# Patient Record
Sex: Male | Born: 1975
Health system: Southern US, Community
[De-identification: ages and names within clinical notes are randomized; demographics above are authoritative.]

## PROBLEM LIST (undated history)

## (undated) DIAGNOSIS — Z8719 Personal history of other diseases of the digestive system: Secondary | ICD-10-CM

## (undated) DIAGNOSIS — K219 Gastro-esophageal reflux disease without esophagitis: Secondary | ICD-10-CM

## (undated) HISTORY — PX: ESOPHAGOGASTRODUODENOSCOPY: SHX1529

## (undated) HISTORY — PX: NO PAST SURGERIES: SHX2092

---

## 2001-09-25 ENCOUNTER — Encounter: Admission: RE | Admit: 2001-09-25 | Discharge: 2001-09-25 | Payer: Self-pay | Admitting: Pediatrics

## 2001-09-25 ENCOUNTER — Encounter: Payer: Self-pay | Admitting: Pediatrics

## 2009-11-01 ENCOUNTER — Ambulatory Visit: Payer: Self-pay | Admitting: Family Medicine

## 2009-11-01 DIAGNOSIS — R12 Heartburn: Secondary | ICD-10-CM | POA: Insufficient documentation

## 2009-11-01 DIAGNOSIS — R1013 Epigastric pain: Secondary | ICD-10-CM | POA: Insufficient documentation

## 2009-11-02 ENCOUNTER — Ambulatory Visit: Payer: Self-pay | Admitting: Family Medicine

## 2009-11-03 LAB — CONVERTED CEMR LAB
ALT: 46 units/L (ref 0–53)
AST: 31 units/L (ref 0–37)
Albumin: 4.4 g/dL (ref 3.5–5.2)
Alkaline Phosphatase: 52 units/L (ref 39–117)
BUN: 12 mg/dL (ref 6–23)
Basophils Absolute: 0 10*3/uL (ref 0.0–0.1)
Basophils Relative: 0.7 % (ref 0.0–3.0)
Bilirubin, Direct: 0.1 mg/dL (ref 0.0–0.3)
CO2: 27 meq/L (ref 19–32)
Calcium: 9.5 mg/dL (ref 8.4–10.5)
Chloride: 109 meq/L (ref 96–112)
Cholesterol: 153 mg/dL (ref 0–200)
Creatinine, Ser: 0.9 mg/dL (ref 0.4–1.5)
Eosinophils Absolute: 0.2 10*3/uL (ref 0.0–0.7)
Eosinophils Relative: 2.7 % (ref 0.0–5.0)
GFR calc non Af Amer: 101.89 mL/min (ref >60)
Glucose, Bld: 86 mg/dL (ref 70–99)
H Pylori IgG: POSITIVE
HCT: 45.9 % (ref 39.0–52.0)
HDL: 49.1 mg/dL (ref >39.00)
Hemoglobin: 15 g/dL (ref 13.0–17.0)
LDL Cholesterol: 90 mg/dL (ref 0–99)
Lymphocytes Relative: 38.4 % (ref 12.0–46.0)
Lymphs Abs: 2.3 10*3/uL (ref 0.7–4.0)
MCHC: 32.8 g/dL (ref 30.0–36.0)
MCV: 93.9 fL (ref 78.0–100.0)
Monocytes Absolute: 0.4 10*3/uL (ref 0.1–1.0)
Monocytes Relative: 6.9 % (ref 3.0–12.0)
Neutro Abs: 3 10*3/uL (ref 1.4–7.7)
Neutrophils Relative %: 51.3 % (ref 43.0–77.0)
Platelets: 257 10*3/uL (ref 150.0–400.0)
Potassium: 3.8 meq/L (ref 3.5–5.1)
RBC: 4.89 M/uL (ref 4.22–5.81)
RDW: 12.3 % (ref 11.5–14.6)
Sodium: 140 meq/L (ref 135–145)
Total Bilirubin: 0.8 mg/dL (ref 0.3–1.2)
Total CHOL/HDL Ratio: 3
Total Protein: 6.9 g/dL (ref 6.0–8.3)
Triglycerides: 68 mg/dL (ref 0.0–149.0)
VLDL: 13.6 mg/dL (ref 0.0–40.0)
WBC: 5.9 10*3/uL (ref 4.5–10.5)

## 2009-11-05 ENCOUNTER — Encounter (INDEPENDENT_AMBULATORY_CARE_PROVIDER_SITE_OTHER): Payer: Self-pay | Admitting: *Deleted

## 2009-11-05 ENCOUNTER — Ambulatory Visit: Payer: Self-pay | Admitting: Gastroenterology

## 2009-11-09 ENCOUNTER — Ambulatory Visit: Payer: Self-pay | Admitting: Gastroenterology

## 2009-11-10 ENCOUNTER — Encounter: Payer: Self-pay | Admitting: Gastroenterology

## 2010-03-16 ENCOUNTER — Ambulatory Visit: Payer: Self-pay | Admitting: Internal Medicine

## 2010-03-16 DIAGNOSIS — K921 Melena: Secondary | ICD-10-CM | POA: Insufficient documentation

## 2010-03-16 LAB — CONVERTED CEMR LAB
OCCULT 1: NEGATIVE
OCCULT 2: NEGATIVE

## 2010-03-18 LAB — CONVERTED CEMR LAB
Basophils Absolute: 0.1 10*3/uL (ref 0.0–0.1)
Basophils Relative: 0.9 % (ref 0.0–3.0)
Eosinophils Absolute: 0.2 10*3/uL (ref 0.0–0.7)
Eosinophils Relative: 2.3 % (ref 0.0–5.0)
HCT: 43.9 % (ref 39.0–52.0)
Hemoglobin: 15 g/dL (ref 13.0–17.0)
Lymphocytes Relative: 39.5 % (ref 12.0–46.0)
Lymphs Abs: 2.6 10*3/uL (ref 0.7–4.0)
MCHC: 34.1 g/dL (ref 30.0–36.0)
MCV: 92.1 fL (ref 78.0–100.0)
Monocytes Absolute: 0.4 10*3/uL (ref 0.1–1.0)
Monocytes Relative: 6.5 % (ref 3.0–12.0)
Neutro Abs: 3.3 10*3/uL (ref 1.4–7.7)
Neutrophils Relative %: 50.8 % (ref 43.0–77.0)
Platelets: 280 10*3/uL (ref 150.0–400.0)
RBC: 4.77 M/uL (ref 4.22–5.81)
RDW: 12.3 % (ref 11.5–14.6)
WBC: 6.5 10*3/uL (ref 4.5–10.5)

## 2010-10-18 NOTE — Assessment & Plan Note (Signed)
Summary: BLOOD IN STOOL/CBS   Vital Signs:  Patient profile:   35 year old male Weight:      217 pounds Pulse rate:   86 / minute Pulse rhythm:   regular BP sitting:   128 / 86  (left arm) Cuff size:   regular  Vitals Entered By: Army Fossa CMA (March 16, 2010 11:32 AM) CC: Follow up for blood in stool Comments Went to rehab 2 months ago, had blood in stool. was told to f/u in 1 month to just check. Not seeing any blood in stool now.    History of Present Illness: he was admitted for inpatient rehabilitation in April, in the process they did a physical exam on him and he was found to have Hemoccult-positive stools He is essentially asymptomatic, see review of systems  Allergies (verified): 1)  ! Paxil  Past History:  Past Medical History: h/o ETOH, cocaine, s/p rehab 2005 and 4-11(inpatient)  FAMILY HISTORY OF CAD MALE 1ST DEGREE RELATIVE <50 (ICD-V17.3) HEARTBURN (ICD-787.1) FAMILY HISTORY DIABETES 1ST DEGREE RELATIVE (ICD-V18.0)  Family History: Reviewed history from 11/05/2009 and no changes required. Family History Diabetes 1st degree relative Family History Hypertension Family History of CAD Male 1st degree relative <50 No colon cancer  Review of Systems General:  Denies fatigue and weight loss. GI:  Denies abdominal pain, diarrhea, nausea, and vomiting; he had heartburn but that is a lot better with Prevacid and lifestyle changes.  Physical Exam  General:  alert and well-developed.   Lungs:  normal respiratory effort, no intercostal retractions, no accessory muscle use, and normal breath sounds.   Heart:  normal rate, regular rhythm, and no murmur.   Abdomen:  soft, non-tender, no distention, no masses, no guarding, and no rigidity.     Impression & Recommendations:  Problem # 1:  GUAIAC POSITIVE STOOL (ICD-578.1)  he had severe heartburn, status post EGD 2-11 Status post treatment for H. pylori Now asymptomatic, no family history of colon  Cancer He had positive Hemoccults back in April 2011 self collected  Hemoccult today--->  (-) CBC today  if no anemia, no further w/u at this point  Orders: Venipuncture (81191) TLB-CBC Platelet - w/Differential (85025-CBCD)  Laboratory Results    Stool - Occult Blood Hemmoccult #1: negative Hemoccult #2: negative

## 2010-10-18 NOTE — Letter (Signed)
Summary: Results Letter  [facility]  [address] [place], [place] [address]   Phone: [phone]  Fax: [phone]        November 10, 2009 MRN: [id number]    Patrick Walls [address] [place], [place]  [address]    Dear Mr. CREASON,   The biopsies taken during your recent upper endoscopy suggest that you indeed did have the H. pylori bacteria in your stomach and that the antibiotics you already started are working.  You should continue to follow the recommnedations that we discussed at the time of your procedure.  Please feel free to call if you have any further questions or concerns.       Sincerely,  Rachael Fee MD  This letter has been electronically signed by your physician.  Appended Document: Results Letter  Letter mailed 2.24.11

## 2010-10-18 NOTE — Procedures (Signed)
Summary: Upper Endoscopy  Patient: Patrick Walls Note: All result statuses are Final unless otherwise noted.  Tests: (1) Upper Endoscopy (EGD)   EGD Upper Endoscopy       DONE     [facility]     [address]     [place], [place]  [address]           ENDOSCOPY PROCEDURE REPORT           PATIENT:  Patrick Walls, Patrick Walls  MR#:  [id number]     BIRTHDATE:  [birth year], 33 yrs. old  GENDER:  male     ENDOSCOPIST:  Rachael Fee, MD     Referred by:  Loreen Freud, DO     PROCEDURE DATE:  11/09/2009     PROCEDURE:  EGD with biopsy     ASA CLASS:  Class I     INDICATIONS:  abdominal pain, dypepsia, chronic GERD symptoms     MEDICATIONS:   Fentanyl 75 mcg IV, Versed 8 mg IV     TOPICAL ANESTHETIC:  Exactacain Spray           DESCRIPTION OF PROCEDURE:   After the risks benefits and     alternatives of the procedure were thoroughly explained, informed     consent was obtained.  The LB GIF-H180 [id number] endoscope was     introduced through the mouth and advanced to the second portion of     the duodenum, without limitations.  The instrument was slowly     withdrawn as the mucosa was fully examined.     <<PROCEDUREIMAGES>>     There was mild, non-specific gastritis. Biopsies taken to check     for H. pylori (see image1).  There was mild reflux type     esophagitis (see image7).  A hiatal hernia was found, this was 1-2     cm (see image5).  Otherwise the examination was normal (see     image4, image6, and image2).  Retroflexed views revealed no     abnormalities.    The scope was then withdrawn from the patient     and the procedure completed.     COMPLICATIONS:  None     ENDOSCOPIC IMPRESSION:     1) Mild gastritis, biopsied to check for H. pylori     2) Esophagitis, mild, likely from chronic GERD     3) Small hiatal hernia, this will predispose him to GERD     4) Otherwise normal examination           RECOMMENDATIONS:     He will complete the H. pylori eradication antibiotics  that were     already started for positive serology.     Following that, he will start once daily OTC prilosec for     chronic GERD symptoms.           ______________________________     Rachael Fee, MD           n.     eSIGNED:   Rachael Fee at 11/09/2009 11:33 AM           Valeda Malm, [id number]  Note: An exclamation mark (!) indicates a result that was not dispersed into the flowsheet. Document Creation Date: 11/09/2009 11:33 AM _______________________________________________________________________  (1) Order result status: Final Collection or observation date-time: 11/09/2009 11:25 Requested date-time:  Receipt date-time:  Reported date-time:  Referring Physician:   Ordering Physician: Rob Bunting [phone]) Specimen Source:  Source: Launa Grill  Order Number: [id number] Lab site:

## 2010-10-18 NOTE — Letter (Signed)
Summary: EGD Instructions  [facility]  [address] [place], [place] [address]   Phone: [phone]  Fax: [phone]       ASAIAH SCARBER    [birth year]    MRN: [id number]       Procedure Day /Date:11/09/09     Arrival Time: 1030 am     Procedure Time:1130 am     Location of Procedure:                    [facility] (4th Floor)    PREPARATION FOR ENDOSCOPY   On 11/09/09 THE DAY OF THE PROCEDURE:  1.   No solid foods, milk or milk products are allowed after midnight the night before your procedure.  2.   Do not drink anything colored red or purple.  Avoid juices with pulp.  No orange juice.  3.  You may drink clear liquids until 930 am, which is 2 hours before your procedure.                                                                                                CLEAR LIQUIDS INCLUDE: Water Jello Ice Popsicles Tea (sugar ok, no milk/cream) Powdered fruit flavored drinks Coffee (sugar ok, no milk/cream) Gatorade Juice: apple, white grape, white cranberry  Lemonade Clear bullion, consomm, broth Carbonated beverages (any kind) Strained chicken noodle soup Hard Candy   MEDICATION INSTRUCTIONS  Unless otherwise instructed, you should take regular prescription medications with a small sip of water as early as possible the morning of your procedure.             OTHER INSTRUCTIONS  You will need a responsible adult at least 37 years of age to accompany you and drive you home.   This person must remain in the waiting room during your procedure.  Wear loose fitting clothing that is easily removed.  Leave jewelry and other valuables at home.  However, you may wish to bring a book to read or an iPod/MP3 player to listen to music as you wait for your procedure to start.  Remove all body piercing jewelry and leave at home.  Total time from sign-in until discharge is approximately 2-3 hours.  You should go home directly after  your procedure and rest.  You can resume normal activities the day after your procedure.  The day of your procedure you should not:   Drive   Make legal decisions   Operate machinery   Drink alcohol   Return to work  You will receive specific instructions about eating, activities and medications before you leave.    The above instructions have been reviewed and explained to me by   _______________________    I fully understand and can verbalize these instructions _____________________________ Date _________

## 2010-10-18 NOTE — Assessment & Plan Note (Signed)
Summary: NEW ACUTE PT HERNIA/AETNA/NS/KDC   Vital Signs:  Patient profile:   35 year old male Height:      69.5 inches Weight:      223 pounds BMI:     32.58 Temp:     98.1 degrees F oral Pulse rate:   84 / minute Pulse rhythm:   regular BP sitting:   126 / 80  (left arm) Cuff size:   regular  Vitals Entered By: Army Fossa CMA (November 01, 2009 12:57 PM) CC: New to establish- Pt states over the last 3 months when bending over he feels a pain near his rib cage. Pt says it is uncomfortable. , Heartburn   History of Present Illness: Pt here to establish.    Heartburn      This is a 35 year old man who presents with Heartburn.  Pt here c/o heartburn for years but recently he has had L sided upper abd pain that is worsening.  The patient complains of epigastric pain.  The patient denies the following alarm features: melena, dysphagia, hematemesis, vomiting, involuntary weight loss >5%, and history of anemia.  Symptoms are worse with alcohol, spicy foods, and citrus.  Treatment that was tried and either found to be ineffective or stopped due to problems include an antacid.    Preventive Screening-Counseling & Management  Alcohol-Tobacco     Alcohol drinks/day: <1     Alcohol type: beer     Smoking Status: current     Smoking Cessation Counseling: yes     Smoke Cessation Stage: ready     Packs/Day: 0.5     Year Started: 1994  Caffeine-Diet-Exercise     Caffeine use/day: 4      Drug Use:  no.    Current Medications (verified): 1)  Prilosec Otc 20 Mg Tbec (Omeprazole Magnesium) .Marland Kitchen.. 1 By Mouth Once Daily  Allergies (verified): No Known Drug Allergies  Past History:  Family History: Last updated: 11/01/2009 Family History Diabetes 1st degree relative Family History Hypertension Family History of CAD Male 1st degree relative <50  Social History: Last updated: 11/01/2009 Current Smoker Alcohol use-no Drug use-no  Risk Factors: Smoking Status: current  (11/01/2009) Packs/Day: 0.5 (11/01/2009)  Past medical, surgical, family and social histories (including risk factors) reviewed for relevance to current acute and chronic problems.  Past Medical History: Current Problems:  HEARTBURN (ICD-787.1) FAMILY HISTORY DIABETES 1ST DEGREE RELATIVE  Current Problems:  ABDOMINAL PAIN, EPIGASTRIC (ICD-789.06) FAMILY HISTORY OF CAD MALE 1ST DEGREE RELATIVE <50 (ICD-V17.3) HEARTBURN (ICD-787.1) FAMILY HISTORY DIABETES 1ST DEGREE RELATIVE (ICD-V18.0)  Family History: Reviewed history and no changes required. Family History Diabetes 1st degree relative Family History Hypertension Family History of CAD Male 1st degree relative <50  Social History: Reviewed history and no changes required. Current Smoker Alcohol use-no Drug use-no Smoking Status:  current Drug Use:  no Packs/Day:  0.5 Caffeine use/day:  4  Review of Systems      See HPI  Physical Exam  General:  Well-developed,well-nourished,in no acute distress; alert,appropriate and cooperative throughout examination Neck:  No deformities, masses, or tenderness noted. Lungs:  Normal respiratory effort, chest expands symmetrically. Lungs are clear to auscultation, no crackles or wheezes. Heart:  Normal rate and regular rhythm. S1 and S2 normal without gallop, murmur, click, rub or other extra sounds. Abdomen:  + mid epigastric and Lsided abd tenderness soft, normal bowel sounds, no distention, no masses, no guarding, no rigidity, and no rebound tenderness.   Psych:  Oriented X3 and  normally interactive.     Impression & Recommendations:  Problem # 1:  ABDOMINAL PAIN, EPIGASTRIC (ICD-789.06) Several year hx of heartburn symptoms--- will refer to GI for possible EGD Prilosec otc daily Discussed symptom control with the patient.   Orders: Gastroenterology Referral (GI) EKG w/ Interpretation (93000)  Complete Medication List: 1)  Prilosec Otc 20 Mg Tbec (Omeprazole magnesium) .Marland Kitchen..  1 by mouth once daily  Patient Instructions: 1)  v17.3.  787.1   cbc, bmp, hep, lipid, hpylori   EKG  Procedure date:  11/01/2009  Findings:      Normal sinus rhythm with rate of:  68 bpm

## 2010-10-18 NOTE — Assessment & Plan Note (Signed)
History of Present Illness Visit Type: Initial Consult Primary GI MD: Rob Bunting MD Primary Provider: Loreen Freud, MD Requesting Provider: Loreen Freud, MD Chief Complaint: Epigastric pain History of Present Illness:     very pleasant 35 year old man, here with his GF today, who has had discomforts in abd 4-5 months.  Has had years of pyrosis, takes rolaid 2 times a week, very diet dependent.  Avoids tomatoes.  No nausea, vomitting.  No weight changes recently.  No dysphagia.  Abd straining can bring on the pain.  Eating too much will cause pains.  he was seen in primary care last week for this complaint. Proton pump inhibitor was started empirically. Blood tests show normal CBC, normal complete metabolic profile, H. pylori antibody was positive , he started prev pac meds yesterday.  No NSAIDs, very rare tylenol.           Current Medications (verified): 1)  Prevpac  Misc (Amoxicill-Clarithro-Lansopraz) .... As Directed.  Allergies (verified): No Known Drug Allergies  Past History:  Past Medical History: ABDOMINAL PAIN, EPIGASTRIC (ICD-789.06) FAMILY HISTORY OF CAD MALE 1ST DEGREE RELATIVE <50 (ICD-V17.3) HEARTBURN (ICD-787.1) FAMILY HISTORY DIABETES 1ST DEGREE RELATIVE (ICD-V18.0)  Past Surgical History: none  Family History: Family History Diabetes 1st degree relative Family History Hypertension Family History of CAD Male 1st degree relative <50 No colon cancer  Social History: Current Smoker half pack of cigarettes a day Alcohol use-no Drug use-no drinks 4 caffeinated beverages a day Works as a Scientist, forensic 3 daughters  Review of Systems       Pertinent positive and negative review of systems were noted in the above HPI and GI specific review of systems.  All other review of systems was otherwise negative.   Vital Signs:  Patient profile:   35 year old male Height:      69.5 inches Weight:      222.8 pounds Pulse rate:   78 / minute Pulse rhythm:    regular BP sitting:   122 / 76  (left arm) Cuff size:   regular  Vitals Entered By: Harlow Mares CMA Duncan Dull) (November 05, 2009 1:36 PM)  Physical Exam  Additional Exam:  Constitutional: generally well appearing Psychiatric: alert and oriented times 3 Eyes: extraocular movements intact Mouth: oropharynx moist, no lesions Neck: supple, no lymphadenopathy Cardiovascular: heart regular rate and rythm Lungs: CTA bilaterally Abdomen: soft, non-tender, non-distended, no obvious ascites, no peritoneal signs, normal bowel sounds Extremities: no lower extremity edema bilaterally Skin: no lesions on visible extremities    Impression & Recommendations:  Problem # 1:  GERD, possible GERD related dyspepsia, H. pylori antibody positive it does seem that he at least has an issue with GERD. That may explain all his symptoms. He was H. pylori antibody positive on recent lab tests and has started eradication antibiotics. I recommended he complete those antibiotics. Given his long-term history of reflux, classic GERD symptoms, I recommended we proceed with EGD at his soonest convenience to screen for Barrett's, other complications, I would also biopsy of her H. pylori to see if he indeed has infection.  Patient Instructions: 1)  Complete the Prev Pac. 2)  When it is done, resume once daily OTC prilosec. 3)  You will be scheduled to have an upper endoscopy. 4)  A copy of this information will be sent to Dr. Laury Axon. 5)  The medication list was reviewed and reconciled.  All changed / newly prescribed medications were explained.  A complete medication list was  provided to the patient / caregiver.  Appended Document: Orders Update/EGD    Clinical Lists Changes  Orders: Added new Test order of EGD (EGD) - Signed

## 2011-04-29 ENCOUNTER — Inpatient Hospital Stay (INDEPENDENT_AMBULATORY_CARE_PROVIDER_SITE_OTHER)
Admission: RE | Admit: 2011-04-29 | Discharge: 2011-04-29 | Disposition: A | Discharge status: Home / Self Care | Payer: BC Managed Care – PPO | Source: Ambulatory Visit | Attending: Family Medicine | Admitting: Family Medicine

## 2011-04-29 ENCOUNTER — Encounter: Payer: Self-pay | Admitting: Family Medicine

## 2011-04-29 DIAGNOSIS — Z20828 Contact with and (suspected) exposure to other viral communicable diseases: Secondary | ICD-10-CM

## 2011-05-02 ENCOUNTER — Telehealth (INDEPENDENT_AMBULATORY_CARE_PROVIDER_SITE_OTHER): Payer: Self-pay | Admitting: *Deleted

## 2011-08-21 NOTE — Telephone Encounter (Signed)
  Phone Note Outgoing Call   Call placed by: Clemens Catholic LPN,  May 02, 2011 2:15 PM Call placed to: Patient Summary of Call: call back: spoke to pt and given lab results. Initial call taken by: Clemens Catholic LPN,  May 02, 2011 2:16 PM

## 2011-08-21 NOTE — Progress Notes (Signed)
Summary: SCREENING(rm5)   Vital Signs:  Patient Profile:   35 Years Old Male CC:      screening-std Height:     69.5 inches Weight:      216 pounds O2 Sat:      99 % O2 treatment:    Room Air Temp:     98.1 degrees F oral Pulse rate:   66 / minute Resp:     18 per minute BP sitting:   130 / 86  (left arm) Cuff size:   regular  Vitals Entered By: Linton Flemings RN (April 29, 2011 12:49 PM)                  Updated Prior Medication List: No Medications Current Allergies: ! PAXIL (PAROXETINE HCL)History of Present Illness Chief Complaint: screening-std History of Present Illness: 35 yo M here without any complaints just wants to be screened for STIs.  Was in long monogamous relationship for 10+ years and recently separated from his wife.  No h/o genital lesions, rashes, penile discharge, dysuria, or other complaints.  No prior h/o STIs  REVIEW OF SYSTEMS Constitutional Symptoms      Denies fever, chills, night sweats, weight loss, weight gain, and fatigue.  Eyes       Denies change in vision, eye pain, eye discharge, glasses, contact lenses, and eye surgery. Ear/Nose/Throat/Mouth       Denies hearing loss/aids, change in hearing, ear pain, ear discharge, dizziness, frequent runny nose, frequent nose bleeds, sinus problems, sore throat, hoarseness, and tooth pain or bleeding.  Respiratory       Denies dry cough, productive cough, wheezing, shortness of breath, asthma, bronchitis, and emphysema/COPD.  Cardiovascular       Denies murmurs, chest pain, and tires easily with exhertion.    Gastrointestinal       Denies stomach pain, nausea/vomiting, diarrhea, constipation, blood in bowel movements, and indigestion. Genitourniary       Denies painful urination, kidney stones, and loss of urinary control. Neurological       Denies paralysis, seizures, and fainting/blackouts. Musculoskeletal       Denies muscle pain, joint pain, joint stiffness, decreased range of motion,  redness, swelling, muscle weakness, and gout.  Skin       Denies bruising, unusual mles/lumps or sores, and hair/skin or nail changes.  Psych       Denies mood changes, temper/anger issues, anxiety/stress, speech problems, depression, and sleep problems. Other Comments: denies any symptoms. requesting for screening for STD's   Family History: Reviewed history from 11/05/2009 and no changes required. Family History Diabetes 1st degree relative Family History Hypertension Family History of CAD Male 1st degree relative <50 No colon cancer Physical Exam General appearance: well developed, well nourished, no acute distress GU: patient declined GU exam Assessment New Problems: CONTACT OR EXPOSURE TO OTHER VIRAL DISEASES (ICD-V01.79)   Plan New Orders: T-GC Probe, urine [id number] T-Chlamydia  Probe, urine [id number] T-HIV Antibody  (Reflex) [[id number]] T-RPR (Syphilis) [[id number]] New Patient Level II [99202] Planning Comments:   will screen patient for GC/chlamydia with urine test, HIV and RPR blood tests as well.  Will call him with results.   The patient and/or caregiver has been counseled thoroughly with regard to medications prescribed including dosage, schedule, interactions, rationale for use, and possible side effects and they verbalize understanding.  Diagnoses and expected course of recovery discussed and will return if not improved as expected or if the condition worsens. Patient and/or caregiver verbalized understanding.  Orders Added: 1)  T-GC Probe, urine [id number] 2)  T-Chlamydia  Probe, urine [phone] 3)  T-HIV Antibody  (Reflex) [[id number]] 4)  T-RPR (Syphilis) [[id number]] 5)  New Patient Level II [44010]

## 2011-10-24 ENCOUNTER — Emergency Department (INDEPENDENT_AMBULATORY_CARE_PROVIDER_SITE_OTHER)
Admission: EM | Admit: 2011-10-24 | Discharge: 2011-10-24 | Disposition: A | Discharge status: Home / Self Care | Payer: 59 | Source: Home / Self Care | Attending: Emergency Medicine | Admitting: Emergency Medicine

## 2011-10-24 ENCOUNTER — Encounter: Payer: Self-pay | Admitting: *Deleted

## 2011-10-24 DIAGNOSIS — J329 Chronic sinusitis, unspecified: Secondary | ICD-10-CM

## 2011-10-24 MED ORDER — AMOXICILLIN-POT CLAVULANATE 875-125 MG PO TABS: 1.0000 | ORAL_TABLET | Freq: Two times a day (BID) | ORAL | Status: AC

## 2011-10-24 NOTE — ED Notes (Signed)
Pt c/o RT ear ache and jaw pain x 2 days. He also c/o sinus pain and productive cough x on and off 2 mths. He has taken dayquil.

## 2011-10-24 NOTE — ED Provider Notes (Signed)
History     CSN: [id number]  Arrival date & time 10/24/11  1318   First MD Initiated Contact with Patient 10/24/11 1324      Chief Complaint  Patient presents with  . Otalgia  . Cough  . Facial Pain    (Consider location/radiation/quality/duration/timing/severity/associated sxs/prior treatment) HPI Patrick Walls is a 36 y.o. male who complains of onset of cold symptoms for 7 days.  + sore throat + cough No pleuritic pain No wheezing + nasal congestion + post-nasal drainage ++ sinus pain/pressure No chest congestion No itchy/red eyes + R earache No hemoptysis No SOB + chills/sweats No fever No nausea No vomiting No abdominal pain No diarrhea No skin rashes No fatigue No myalgias + headache    No past medical history on file.  No past surgical history on file.  No family history on file.  History  Substance Use Topics  . Smoking status: Not on file  . Smokeless tobacco: Not on file  . Alcohol Use: Not on file      Review of Systems  Allergies  Paroxetine  Home Medications  No current outpatient prescriptions on file.  BP 112/77  Pulse 78  Temp(Src) 98.2 F (36.8 C) (Oral)  Resp 18  Ht 5\' 10"  (1.778 m)  Wt 212 lb (96.163 kg)  BMI 30.42 kg/m2  SpO2 97%  Physical Exam  Nursing note and vitals reviewed. Constitutional: He is oriented to person, place, and time. He appears well-developed and well-nourished.  HENT:  Head: Normocephalic and atraumatic.  Right Ear: Tympanic membrane, external ear and ear canal normal.  Left Ear: Tympanic membrane, external ear and ear canal normal.  Nose: Mucosal edema and rhinorrhea present.  Mouth/Throat: Posterior oropharyngeal erythema present. No oropharyngeal exudate or posterior oropharyngeal edema.       R sided maxillary sinus tenderness  Eyes: No scleral icterus.  Neck: Neck supple.  Cardiovascular: Regular rhythm and normal heart sounds.   Pulmonary/Chest: Effort normal and breath sounds normal. No  respiratory distress.  Neurological: He is alert and oriented to person, place, and time.  Skin: Skin is warm and dry.  Psychiatric: He has a normal mood and affect. His speech is normal.    ED Course  Procedures (including critical care time)  Labs Reviewed - No data to display No results found.   No diagnosis found.    MDM  1)  Take the prescribed antibiotic as instructed. 2)  Use nasal saline solution (over the counter) at least 3 times a day. 3)  Use over the counter decongestants like Zyrtec-D every 12 hours as needed to help with congestion.  If you have hypertension, do not take medicines with sudafed.  4)  Can take tylenol every 6 hours or motrin every 8 hours for pain or fever. 5)  Follow up with your primary doctor if no improvement in 5-7 days, sooner if increasing pain, fever, or new symptoms.      Lily Kocher, MD 10/24/11 1340

## 2015-05-13 ENCOUNTER — Ambulatory Visit: Payer: Self-pay | Admitting: Family Medicine

## 2015-09-08 ENCOUNTER — Ambulatory Visit (INDEPENDENT_AMBULATORY_CARE_PROVIDER_SITE_OTHER): Payer: 59 | Admitting: Family Medicine

## 2015-09-08 ENCOUNTER — Encounter: Payer: Self-pay | Admitting: Family Medicine

## 2015-09-08 VITALS — BP 142/80 | HR 76 | Ht 69.0 in | Wt 216.0 lb

## 2015-09-08 DIAGNOSIS — R221 Localized swelling, mass and lump, neck: Secondary | ICD-10-CM | POA: Diagnosis not present

## 2015-09-08 NOTE — Assessment & Plan Note (Signed)
Initially the appearance of the cyst was consistent with a non-infected epidermoid cyst. I think this is still a certain possibility. However the location of the cyst and the fact that it was not easily identified on incision and attempted removal increases the likelihood that this is a lymph node or a deep epidermoid cyst. Because of the location and access to other structures I thought it was reasonable to discontinue the procedure. Refer to general surgery for removal. Return in one week for nurse check to recheck blood pressure and remove suture.

## 2015-09-08 NOTE — Progress Notes (Signed)
       Patrick Walls. is a 39 y.o. male who presents to [place]: [facility] today for establish care and discuss cyst on right posterior neck.   Cyst: Patient notes he small superficial nodule on the right posterior cervical neck. It's been present for a few months and is slightly enlarging. He notes it's mildly painful especially with neck motion. He denies any fevers chills nausea vomiting diarrhea or weight loss. He's concerned because his cousin had a diagnosis of head and neck cancer that was originally diagnosed via a swollen lymph node. He does smoke.  He feels well otherwise.   History reviewed. No pertinent past medical history. History reviewed. No pertinent past surgical history. Social History  Substance Use Topics  . Smoking status: Current Every Day Smoker -- 0.50 packs/day  . Smokeless tobacco: Not on file  . Alcohol Use: Yes   family history includes Heart failure in his father.  ROS as above Medications: No current outpatient prescriptions on file.   No current facility-administered medications for this visit.   Allergies  Allergen Reactions  . Paroxetine      Exam:  BP 142/80 mmHg  Pulse 76  Ht 5' 9"  (1.753 m)  Wt 216 lb (97.977 kg)  BMI 31.88 kg/m2 Gen: Well NAD nontoxic appearing HEENT: EOMI,  MMM small superficial mobile nontender nodule right posterior cervical neck. Approximately 1 cm in diameter. Lungs: Normal work of breathing. CTABL Heart: RRR no MRG Abd: NABS, Soft. Nondistended, Nontender Exts: Brisk capillary refill, warm and well perfused.    Incision and removal of cyst: Consent obtained and timeout performed. Patient warned about the risk of infection bleeding damage to structures and other risks. Skin cleaned with alcohol: Spray applied and 1 mL of lidocaine with epinephrine injected superficially to the cyst. The skin was cleaned with  chlorhexidine and sterile drapes were applied. A sharp incision overlying the cyst was made approximately 1 cm in length. Blunt dissection was used to attempt to get to the level of the cyst. The cystic structure was never definitively identified through the dermis and 1 fascial layer. At this time immediate decision to discontinue the procedure is it was becoming more clear that this was either not a epidermoid cyst infected lymph node or was a very deep cystic structure. I felt as though deeper skin surgery on the posterior neck was more risky than I was comfortable with. A simple interrupted suture using 4-0 Prolene was used to close the incision. Patient tolerated the procedure well. No significant blood loss. Sterile technique used during the entire procedure.   No results found for this or any previous visit (from the past 24 hour(s)). No results found.   Please see individual assessment and plan sections.

## 2015-09-08 NOTE — Patient Instructions (Addendum)
Thank you for coming in today. Call or go to the emergency room if you get worse, have trouble breathing, have chest pains, or palpitations.  You should hear from [facility] soon.  Let me know if you don't hear from them soon.  Return in 1 week for a nurse visit to remove the suture.  Epidermal Cyst An epidermal cyst is sometimes called a sebaceous cyst, epidermal inclusion cyst, or infundibular cyst. These cysts usually contain a substance that looks "pasty" or "cheesy" and may have a bad smell. This substance is a protein called keratin. Epidermal cysts are usually found on the face, neck, or trunk. They may also occur in the vaginal area or other parts of the genitalia of both men and women. Epidermal cysts are usually small, painless, slow-growing bumps or lumps that move freely under the skin. It is important not to try to pop them. This may cause an infection and lead to tenderness and swelling. CAUSES  Epidermal cysts may be caused by a deep penetrating injury to the skin or a plugged hair follicle, often associated with acne. SYMPTOMS  Epidermal cysts can become inflamed and cause:  Redness.  Tenderness.  Increased temperature of the skin over the bumps or lumps.  Grayish-white, bad smelling material that drains from the bump or lump. DIAGNOSIS  Epidermal cysts are easily diagnosed by your caregiver during an exam. Rarely, a tissue sample (biopsy) may be taken to rule out other conditions that may resemble epidermal cysts. TREATMENT   Epidermal cysts often get better and disappear on their own. They are rarely ever cancerous.  If a cyst becomes infected, it may become inflamed and tender. This may require opening and draining the cyst. Treatment with antibiotics may be necessary. When the infection is gone, the cyst may be removed with minor surgery.  Small, inflamed cysts can often be treated with antibiotics or by injecting steroid medicines.  Sometimes,  epidermal cysts become large and bothersome. If this happens, surgical removal in your caregiver's office may be necessary. HOME CARE INSTRUCTIONS  Only take over-the-counter or prescription medicines as directed by your caregiver.  Take your antibiotics as directed. Finish them even if you start to feel better. SEEK MEDICAL CARE IF:   Your cyst becomes tender, red, or swollen.  Your condition is not improving or is getting worse.  You have any other questions or concerns. MAKE SURE YOU:  Understand these instructions.  Will watch your condition.  Will get help right away if you are not doing well or get worse.   This information is not intended to replace advice given to you by your health care provider. Make sure you discuss any questions you have with your health care provider.   Document Released: 08/05/2004 Document Revised: 11/27/2011 Document Reviewed: 03/13/2011 Elsevier Interactive Patient Education [employer].

## 2015-09-15 ENCOUNTER — Ambulatory Visit: Payer: 59

## 2015-09-15 ENCOUNTER — Encounter: Payer: Self-pay | Admitting: Family Medicine

## 2015-09-15 ENCOUNTER — Ambulatory Visit (INDEPENDENT_AMBULATORY_CARE_PROVIDER_SITE_OTHER): Payer: 59 | Admitting: Family Medicine

## 2015-09-15 VITALS — BP 158/86 | HR 98 | Wt 212.0 lb

## 2015-09-15 DIAGNOSIS — L03221 Cellulitis of neck: Secondary | ICD-10-CM

## 2015-09-15 DIAGNOSIS — L0390 Cellulitis, unspecified: Secondary | ICD-10-CM | POA: Insufficient documentation

## 2015-09-15 MED ORDER — CEFUROXIME AXETIL 500 MG PO TABS: 500.0000 mg | ORAL_TABLET | Freq: Two times a day (BID) | ORAL | Status: DC

## 2015-09-15 MED ORDER — TRAMADOL HCL 50 MG PO TABS: 50.0000 mg | ORAL_TABLET | Freq: Three times a day (TID) | ORAL | Status: DC | PRN

## 2015-09-15 MED ORDER — DOXYCYCLINE HYCLATE 100 MG PO TABS: 100.0000 mg | ORAL_TABLET | Freq: Two times a day (BID) | ORAL | Status: DC

## 2015-09-15 NOTE — Assessment & Plan Note (Addendum)
The wound appears to have cellulitis versus early abscess formation. It does not appear to be drainable at this time.. Treat with Ceftin and doxycycline. Return if not improved. Follow-up general surgery.

## 2015-09-15 NOTE — Progress Notes (Addendum)
       Patrick Walls. is a 39 y.o. male who presents to [place]: [facility] today for remove suture in follow-up possible skin infection  Patient was seen last week for a mass on his neck. This is thought to be noninfected epidermoid cyst. I attempted to incise and removed the cyst however became clear this was either very deep cyst or a lymph node. The incision and removal was abandoned at that time and the wound was sutured shut with one simple interrupted suture. Sterile techniques were used during the procedure. However over the past few days she's noted the area around the incision is become red and tender and painful. He denies any fevers chills nausea vomiting or diarrhea. No treatment tried yet.   No past medical history on file. No past surgical history on file. Social History  Substance Use Topics  . Smoking status: Current Every Day Smoker -- 0.50 packs/day  . Smokeless tobacco: Not on file  . Alcohol Use: Yes   family history includes Heart failure in his father.  ROS as above Medications: Current Outpatient Prescriptions  Medication Sig Dispense Refill  . cefUROXime (CEFTIN) 500 MG tablet Take 1 tablet (500 mg total) by mouth 2 (two) times daily with a meal. 14 tablet 0  . doxycycline (VIBRA-TABS) 100 MG tablet Take 1 tablet (100 mg total) by mouth 2 (two) times daily. 14 tablet 0   No current facility-administered medications for this visit.   Allergies  Allergen Reactions  . Paroxetine      Exam:  BP 158/86 mmHg  Pulse 98  Wt 212 lb (96.163 kg) Gen: Well NAD nontoxic appearing Skin: Right posterior neck. Erythematous tender indurated without fluctuance proximally 1 cm. Near the area of the incision. The incision is well appearing with no expressible pus. The suture is in place and was removed.   No results found for this or any previous visit (from the past 24  hour(s)). No results found.   Please see individual assessment and plan sections.  PT requested pain medicine later in the day. Tramadol faxed to pharmacy.

## 2015-09-15 NOTE — Patient Instructions (Signed)
Thank you for coming in today. Return if not better.  Follow up with general surgery.   Cellulitis Cellulitis is an infection of the skin and the tissue beneath it. The infected area is usually red and tender. Cellulitis occurs most often in the arms and lower legs.  CAUSES  Cellulitis is caused by bacteria that enter the skin through cracks or cuts in the skin. The most common types of bacteria that cause cellulitis are staphylococci and streptococci. SIGNS AND SYMPTOMS   Redness and warmth.  Swelling.  Tenderness or pain.  Fever. DIAGNOSIS  Your health care provider can usually determine what is wrong based on a physical exam. Blood tests may also be done. TREATMENT  Treatment usually involves taking an antibiotic medicine. HOME CARE INSTRUCTIONS   Take your antibiotic medicine as directed by your health care provider. Finish the antibiotic even if you start to feel better.  Keep the infected arm or leg elevated to reduce swelling.  Apply a warm cloth to the affected area up to 4 times per day to relieve pain.  Take medicines only as directed by your health care provider.  Keep all follow-up visits as directed by your health care provider. SEEK MEDICAL CARE IF:   You notice red streaks coming from the infected area.  Your red area gets larger or turns dark in color.  Your bone or joint underneath the infected area becomes painful after the skin has healed.  Your infection returns in the same area or another area.  You notice a swollen bump in the infected area.  You develop new symptoms.  You have a fever. SEEK IMMEDIATE MEDICAL CARE IF:   You feel very sleepy.  You develop vomiting or diarrhea.  You have a general ill feeling (malaise) with muscle aches and pains.   This information is not intended to replace advice given to you by your health care provider. Make sure you discuss any questions you have with your health care provider.   Document Released:  06/14/2005 Document Revised: 05/26/2015 Document Reviewed: 11/20/2011 Elsevier Interactive Patient Education [employer].

## 2015-09-15 NOTE — Addendum Note (Signed)
Addended by: Gregor Hams on: 09/15/2015 03:59 PM   Modules accepted: Orders

## 2015-09-17 ENCOUNTER — Emergency Department (INDEPENDENT_AMBULATORY_CARE_PROVIDER_SITE_OTHER)
Admission: EM | Admit: 2015-09-17 | Discharge: 2015-09-17 | Disposition: A | Discharge status: Home / Self Care | Payer: 59 | Source: Home / Self Care | Attending: Family Medicine | Admitting: Family Medicine

## 2015-09-17 ENCOUNTER — Encounter: Payer: Self-pay | Admitting: Emergency Medicine

## 2015-09-17 DIAGNOSIS — Z5189 Encounter for other specified aftercare: Secondary | ICD-10-CM

## 2015-09-17 NOTE — ED Provider Notes (Signed)
CSN: [id number]     Arrival date & time 09/17/15  1843 History   First MD Initiated Contact with Patient 09/17/15 1847     Chief Complaint  Patient presents with  . Wound Check   (Consider location/radiation/quality/duration/timing/severity/associated sxs/prior Treatment) HPI Pt is a 39yo male presenting to [facility] for a wound recheck of an abscess on the Right posterior side of his neck that was I&D on 09/08/15.  Pt was placed on antibiotics for abscess 3 days ago. Pain is aching and sore, minimal at this time.  He has been taking antibiotics as prescribed but has only done warm compresses on the first day.  He notes the swelling has gone down and pain has improved, however, he notes there is still mild drainage and wonders if I&D needs to be done again as there was a suture that came out on its own after the initial I&D on 09/08/15.  Denies fever, chills, n/v/d.   History reviewed. No pertinent past medical history. History reviewed. No pertinent past surgical history. Family History  Problem Relation Age of Onset  . Heart failure Father    Social History  Substance Use Topics  . Smoking status: Current Every Day Smoker -- 0.50 packs/day  . Smokeless tobacco: None  . Alcohol Use: Yes    Review of Systems  Constitutional: Negative for fever and chills.  Gastrointestinal: Negative for nausea, vomiting and diarrhea.  Musculoskeletal: Positive for myalgias. Negative for back pain, joint swelling and arthralgias.  Skin: Positive for color change and wound.       Right side of neck    Allergies  Paroxetine  Home Medications   Prior to Admission medications   Medication Sig Start Date End Date Taking? Authorizing Provider  cefUROXime (CEFTIN) 500 MG tablet Take 1 tablet (500 mg total) by mouth 2 (two) times daily with a meal. 09/15/15   Gregor Hams, MD  doxycycline (VIBRA-TABS) 100 MG tablet Take 1 tablet (100 mg total) by mouth 2 (two) times daily. 09/15/15   Gregor Hams, MD   traMADol (ULTRAM) 50 MG tablet Take 1 tablet (50 mg total) by mouth every 8 (eight) hours as needed. 09/15/15   Gregor Hams, MD   Meds Ordered and Administered this Visit  Medications - No data to display  BP 123/80 mmHg  Pulse 91  Temp(Src) 98.3 F (36.8 C) (Oral)  Resp 16  Ht 5' 10"  (1.778 m)  Wt 210 lb (95.255 kg)  BMI 30.13 kg/m2  SpO2 97% No data found.   Physical Exam  Constitutional: He is oriented to person, place, and time. He appears well-developed and well-nourished.  HENT:  Head: Normocephalic and atraumatic.  Eyes: EOM are normal.  Neck: Normal range of motion.  Cardiovascular: Normal rate.   Pulmonary/Chest: Effort normal.  Musculoskeletal: Normal range of motion.  Neurological: He is alert and oriented to person, place, and time.  Skin: Skin is warm and dry. There is erythema.  Right posterior side of neck: quarter sided raised, erythematous area of erythema and induration. Centralized incision that is healing well. No fluctuance. Scant serosanguinous liquid.  No red streaking.   Psychiatric: He has a normal mood and affect. His behavior is normal.  Nursing note and vitals reviewed.   ED Course  Procedures (including critical care time)  Labs Review Labs Reviewed - No data to display  Imaging Review No results found.   MDM   1. Wound check, abscess    Pt presenting to [facility]  for abscess wound check. Abscess appears to be healing well. Induration present but no fluctuance to justify additional I&D.    Encouraged to use warm compresses 3-4 times a day and to keep taking antibiotics as prescribed. F/u with Dr. Georgina Snell in 4-5 days if not improving, otherwise f/u as previously scheduled. Patient verbalized understanding and agreement with treatment plan.     Noland Fordyce, PA-C 09/17/15 [phone]

## 2015-09-17 NOTE — Discharge Instructions (Signed)
Be sure to continue taking all of your antibiotics.  You should also be using warm compresses 3-4 times a day including warm water with epsom salt or warm soapy water.

## 2015-09-17 NOTE — ED Notes (Signed)
Had cyst on back of neck incised 09-08-2015; feels it may be infected, although he was placed on antibiotics by pcp.

## 2015-09-21 ENCOUNTER — Telehealth: Payer: Self-pay | Admitting: *Deleted

## 2015-10-07 ENCOUNTER — Encounter: Payer: 59 | Admitting: Family Medicine

## 2015-12-27 ENCOUNTER — Ambulatory Visit: Payer: 59 | Admitting: Family Medicine

## 2015-12-28 ENCOUNTER — Ambulatory Visit: Payer: Self-pay | Admitting: Family Medicine

## 2016-01-03 ENCOUNTER — Encounter: Payer: Self-pay | Admitting: Family Medicine

## 2016-01-03 ENCOUNTER — Ambulatory Visit (INDEPENDENT_AMBULATORY_CARE_PROVIDER_SITE_OTHER): Payer: Self-pay | Admitting: Family Medicine

## 2016-01-03 VITALS — BP 139/97 | HR 103 | Wt 211.0 lb

## 2016-01-03 DIAGNOSIS — M5442 Lumbago with sciatica, left side: Secondary | ICD-10-CM | POA: Insufficient documentation

## 2016-01-03 MED ORDER — PREDNISONE 10 MG (48) PO TBPK: ORAL_TABLET | Freq: Every day | ORAL | Status: DC

## 2016-01-03 MED ORDER — METHOCARBAMOL 500 MG PO TABS: 500.0000 mg | ORAL_TABLET | Freq: Three times a day (TID) | ORAL | Status: DC

## 2016-01-03 MED FILL — predniSONE 10 MG TABS: 10 | 12 days supply | Qty: 48 | Fill #0

## 2016-01-03 MED FILL — METHOCARBAMOL 500 MG TABLET: 500 | 30 days supply | Qty: 90 | Fill #0

## 2016-01-03 NOTE — Progress Notes (Signed)
   Patrick Walls. is a 40 y.o. male who presents to [place] today for back pain. Patient has a two-week history of left-sided low back pain radiating to the left leg. He's had pain off and on now for several months but has been worsening over the last month and worsening again over the last 2 weeks. He notes he's had difficulty working since early April. He notes the back pain is worse in the radiating pain which is mild. He denies any weakness or numbness or loss of function. He's tried multiple over-the-counter medicines which are only been mildly helpful. He notes he gets health insurance in May and would like to defer imaging or physical therapy until left possible.   No past medical history on file. No past surgical history on file. Social History  Substance Use Topics  . Smoking status: Current Every Day Smoker -- 0.50 packs/day  . Smokeless tobacco: Not on file  . Alcohol Use: Yes   family history includes Heart failure in his father.  ROS:  No headache, visual changes, nausea, vomiting, diarrhea, constipation, dizziness, abdominal pain, skin rash, fevers, chills, night sweats, weight loss, swollen lymph nodes, body aches, joint swelling, muscle aches, chest pain, shortness of breath, mood changes, visual or auditory hallucinations.    Medications: Current Outpatient Prescriptions  Medication Sig Dispense Refill  . methocarbamol (ROBAXIN) 500 MG tablet Take 1 tablet (500 mg total) by mouth 3 (three) times daily. 90 tablet 0  . predniSONE (STERAPRED UNI-PAK 48 TAB) 10 MG (48) TBPK tablet Take by mouth daily. 12 day dosepack po 48 tablet 0   No current facility-administered medications for this visit.   Allergies  Allergen Reactions  . Paroxetine      Exam:  BP 139/97 mmHg  Pulse 103  Wt 211 lb (95.709 kg) General: Well Developed, well nourished, and in no acute distress.  Neuro/Psych: Alert and oriented x3,  extra-ocular muscles intact, able to move all 4 extremities, sensation grossly intact. Skin: Warm and dry, no rashes noted.  Respiratory: Not using accessory muscles, speaking in full sentences, trachea midline.  Cardiovascular: Pulses palpable, no extremity edema. Abdomen: Does not appear distended. MSK: Back: Nontender to midline. Tender palpation left SI joint. Low back motion is normal. Lower extremity strength and reflexes are equal and normal bilaterally. Sensation is intact. Negative straight leg raise test and slump test. Normal gait.   No results found for this or any previous visit (from the past 24 hour(s)). No results found.   Please see individual assessment and plan sections.

## 2016-01-03 NOTE — Patient Instructions (Addendum)
Thank you for coming in today. Come back or go to the emergency room if you notice new weakness new numbness problems walking or bowel or bladder problems.  TENS UNIT: This is helpful for muscle pain and spasm.   Search and Purchase a TENS 7000 2nd edition at www.tenspros.com. It should be less than $30.     TENS unit instructions: Do not shower or bathe with the unit on Turn the unit off before removing electrodes or batteries If the electrodes lose stickiness add a drop of water to the electrodes after they are disconnected from the unit and place on plastic sheet. If you continued to have difficulty, call the TENS unit company to purchase more electrodes. Do not apply lotion on the skin area prior to use. Make sure the skin is clean and dry as this will help prolong the life of the electrodes. After use, always check skin for unusual red areas, rash or other skin difficulties. If there are any skin problems, does not apply electrodes to the same area. Never remove the electrodes from the unit by pulling the wires. Do not use the TENS unit or electrodes other than as directed. Do not change electrode placement without consultating your therapist or physician. Keep 2 fingers with between each electrode. Wear time ratio is 2:1, on to off times.    For example on for 30 minutes off for 15 minutes and then on for 30 minutes off for 15 minutes   Return in May.   Lumbosacral Radiculopathy Lumbosacral radiculopathy is a condition that involves the spinal nerves and nerve roots in the low back and bottom of the spine. The condition develops when these nerves and nerve roots move out of place or become inflamed and cause symptoms. CAUSES This condition may be caused by:  Pressure from a disk that bulges out of place (herniated disk). A disk is a plate of cartilage that separates bones in the spine.  Disk degeneration.  A narrowing of the bones of the lower back (spinal stenosis).  A  tumor.  An infection.  An injury that places sudden pressure on the disks that cushion the bones of your lower spine. RISK FACTORS This condition is more likely to develop in:  Males aged 30-50 years.  Females aged 51-60 years.  People who lift improperly.  People who are overweight or live a sedentary lifestyle.  People who smoke.  People who perform repetitive activities that strain the spine. SYMPTOMS Symptoms of this condition include:  Pain that goes down from the back into the legs (sciatica). This is the most common symptom. The pain may be worse with sitting, coughing, or sneezing.  Pain and numbness in the arms and legs.  Muscle weakness.  Tingling.  Loss of bladder control or bowel control. DIAGNOSIS This condition is diagnosed with a physical exam and medical history. If the pain is lasting, you may have tests, such as:  MRI scan.  X-ray.  CT scan.  Myelogram.  Nerve conduction study. TREATMENT This condition is often treated with:  Hot packs and ice applied to affected areas.  Stretches to improve flexibility.  Exercises to strengthen back muscles.  Physical therapy.  Pain medicine.  A steroid injection in the spine. In some cases, no treatment is needed. If the condition is long-lasting (chronic), or if symptoms are severe, treatment may involve surgery or lifestyle changes, such as following a weight loss plan. HOME CARE INSTRUCTIONS Medicines  Take medicines only as directed by your  health care provider.  Do not drive or operate heavy machinery while taking pain medicine. Injury Care  Apply a heat pack to the injured area as directed by your health care provider.  Apply ice to the affected area:  Put ice in a plastic bag.  Place a towel between your skin and the bag.  Leave the ice on for 20-30 minutes, every 2 hours while you are awake or as needed. Or, leave the ice on for as long as directed by your health care  provider. Other Instructions  If you were shown how to do any exercises or stretches, do them as directed by your health care provider.  If your health care provider prescribed a diet or exercise program, follow it as directed.  Keep all follow-up visits as directed by your health care provider. This is important. SEEK MEDICAL CARE IF:  Your pain does not improve over time even when taking pain medicines. SEEK IMMEDIATE MEDICAL CARE IF:  Your develop severe pain.  Your pain suddenly gets worse.  You develop increasing weakness in your legs.  You lose the ability to control your bladder or bowel.  You have difficulty walking or balancing.  You have a fever.   This information is not intended to replace advice given to you by your health care provider. Make sure you discuss any questions you have with your health care provider.   Document Released: 09/04/2005 Document Revised: 01/19/2015 Document Reviewed: 08/31/2014 Elsevier Interactive Patient Education [employer].

## 2016-01-03 NOTE — Assessment & Plan Note (Signed)
Lumbago with sciatica. Per patient request we'll defer physical therapy and x-ray until May. Prednisone Dosepak and methocarbamol as well as TENS unit and heating pad. Workup provided. Recheck in May. Discussed warning signs and symptoms.

## 2016-01-17 ENCOUNTER — Telehealth: Payer: Self-pay | Admitting: Family Medicine

## 2016-01-17 MED ORDER — NAPROXEN 500 MG PO TABS: 500.0000 mg | ORAL_TABLET | Freq: Two times a day (BID) | ORAL | Status: DC

## 2016-01-17 MED FILL — NAPROXEN 500 MG TABLET: 500 | 15 days supply | Qty: 30 | Fill #0

## 2016-01-17 NOTE — Telephone Encounter (Signed)
Patient has taken the meds you prescribed him but he has seen NO improvement and is hurting more every day.  It is a constant pain despite using a tens unit and heating pad.  Can you please give him something to help with the pain?  He is not someone that ever takes pills, but he is having a really hard time even doing basic things around his house.  He would appreciate anything that might provide him some relief.  He should be receiving his ins card this week so you can order xrays and see if you can figure out what is going on. He is planning to make another appt with you as soon as he has his card.  Thanks!

## 2016-01-17 NOTE — Telephone Encounter (Signed)
Will rx naproxen. Follow up ASAP for further evaluation.

## 2016-01-20 ENCOUNTER — Telehealth: Payer: Self-pay | Admitting: Family Medicine

## 2016-01-20 MED ORDER — PREDNISONE 10 MG (48) PO TBPK: ORAL_TABLET | Freq: Every day | ORAL | Status: DC

## 2016-01-20 NOTE — Telephone Encounter (Signed)
Patient still in A LOT of pain.  He needs something to help with the relief.  He is planning to come in as soon as he receives his insurance card.    He likely needs an MRI.

## 2016-01-20 NOTE — Telephone Encounter (Signed)
We will retry Prednisone pack. MRI ASAP.  Come back or go to the emergency room if you notice new weakness new numbness problems walking or bowel or bladder problems.

## 2016-01-21 ENCOUNTER — Ambulatory Visit: Payer: BLUE CROSS/BLUE SHIELD | Admitting: Family Medicine

## 2016-01-24 ENCOUNTER — Ambulatory Visit: Payer: BLUE CROSS/BLUE SHIELD | Admitting: Family Medicine

## 2016-01-31 ENCOUNTER — Ambulatory Visit (INDEPENDENT_AMBULATORY_CARE_PROVIDER_SITE_OTHER): Payer: BLUE CROSS/BLUE SHIELD | Admitting: Family Medicine

## 2016-01-31 ENCOUNTER — Ambulatory Visit (INDEPENDENT_AMBULATORY_CARE_PROVIDER_SITE_OTHER): Payer: BLUE CROSS/BLUE SHIELD

## 2016-01-31 ENCOUNTER — Encounter: Payer: Self-pay | Admitting: Family Medicine

## 2016-01-31 VITALS — BP 146/89 | HR 96 | Wt 214.0 lb

## 2016-01-31 DIAGNOSIS — M5442 Lumbago with sciatica, left side: Secondary | ICD-10-CM

## 2016-01-31 DIAGNOSIS — M4807 Spinal stenosis, lumbosacral region: Secondary | ICD-10-CM | POA: Diagnosis not present

## 2016-01-31 DIAGNOSIS — M5126 Other intervertebral disc displacement, lumbar region: Secondary | ICD-10-CM

## 2016-01-31 NOTE — Patient Instructions (Signed)
Thank you for coming in today. Get xray and MRI.  We will call with results.   Come back or go to the emergency room if you notice new weakness new numbness problems walking or bowel or bladder problems.

## 2016-01-31 NOTE — Progress Notes (Signed)
       Patrick Walls. is a 40 y.o. male who presents to [facility] today for follow-up back pain. Patient has had back pain in his left low back now for about a month. He notes some pain radiating to the lateral foot. He denies any weakness or numbness. He's tried 2 courses of prednisone Dosepaks which have not helped including methocarbamol which also has not helped very much. The pain is severe and limits his ability to work.   No past medical history on file. No past surgical history on file. Social History  Substance Use Topics  . Smoking status: Current Every Day Smoker -- 0.50 packs/day  . Smokeless tobacco: Not on file  . Alcohol Use: Yes   family history includes Heart failure in his father.  ROS as above Medications: Current Outpatient Prescriptions  Medication Sig Dispense Refill  . methocarbamol (ROBAXIN) 500 MG tablet Take 1 tablet (500 mg total) by mouth 3 (three) times daily. 90 tablet 0  . naproxen (NAPROSYN) 500 MG tablet Take 1 tablet (500 mg total) by mouth 2 (two) times daily with a meal. 30 tablet 0   No current facility-administered medications for this visit.   Allergies  Allergen Reactions  . Paroxetine      Exam:  BP 146/89 mmHg  Pulse 96  Wt 214 lb (97.07 kg) Gen: Well NAD HEENT: EOMI,  MMM Lungs: Normal work of breathing. CTABL Heart: RRR no MRG Abd: NABS, Soft. Nondistended, Nontender Exts: Brisk capillary refill, warm and well perfused.  Back: Tender to palpation left low back including the paraspinal muscles and SI joints. Normal motion. Positive left sided slump test. Lower extremity strength is intact. Normal gait.  No results found for this or any previous visit (from the past 24 hour(s)). Dg Lumbar Spine 2-3 Views  01/31/2016  CLINICAL DATA:  Back pain. Pain radiates into left leg. No known injury. EXAM: LUMBAR SPINE - 2-3 VIEW  COMPARISON:  No prior . FINDINGS: Diffuse multilevel degenerative change. No acute bony abnormality identified. Normal alignment. Calcified pelvic density consistent phlebolith the IMPRESSION: Diffuse multilevel degenerative change.  No acute abnormality. Electronically Signed   By: Marcello Moores  Register   On: 01/31/2016 82:82    41 year old male with left low back pain with left-sided radiculopathy likely at the L5 or S1 nerve root. Plan for x-ray as noted above, and MRI of the low back for epidural steroid injection planning. Will call patient with results.

## 2016-02-01 ENCOUNTER — Telehealth: Payer: Self-pay | Admitting: Family Medicine

## 2016-02-01 DIAGNOSIS — M5416 Radiculopathy, lumbar region: Secondary | ICD-10-CM

## 2016-02-01 NOTE — Telephone Encounter (Signed)
Called and left scheduling message with [facility] to schedule with pt.

## 2016-02-01 NOTE — Progress Notes (Signed)
Quick Note:  Arthritis is bulging disc in the back causes nerve root pinching. We will order an epidural steroid injection that should help. ______

## 2016-02-01 NOTE — Telephone Encounter (Signed)
Epidural steroid injection ordered.

## 2016-02-09 ENCOUNTER — Ambulatory Visit
Admission: RE | Admit: 2016-02-09 | Discharge: 2016-02-09 | Disposition: A | Discharge status: Home / Self Care | Payer: BLUE CROSS/BLUE SHIELD | Source: Ambulatory Visit | Attending: Family Medicine | Admitting: Family Medicine

## 2016-02-09 MED ORDER — METHYLPREDNISOLONE ACETATE 40 MG/ML INJ SUSP (RADIOLOG: 120.0000 mg | Freq: Once | INTRAMUSCULAR | Status: DC

## 2016-02-09 MED ORDER — IOPAMIDOL (ISOVUE-M 200) INJECTION 41%: 1.0000 mL | Freq: Once | INTRAMUSCULAR | Status: DC

## 2016-02-09 NOTE — Discharge Instructions (Signed)

## 2016-02-13 ENCOUNTER — Telehealth: Payer: Self-pay | Admitting: Family Medicine

## 2016-02-13 NOTE — Telephone Encounter (Signed)
So patient wants to try to return to work on Tuesday, just doing some light duty work.  Can you please write a note that he can go back to work with limited lifting restrictions?    I can fax over to his employer.  Thanks!  He reports that he feels like he has gotten some relief from the injection.

## 2016-02-15 ENCOUNTER — Encounter: Payer: Self-pay | Admitting: Family Medicine

## 2016-02-15 NOTE — Telephone Encounter (Signed)
Note written.

## 2016-02-23 ENCOUNTER — Encounter: Payer: Self-pay | Admitting: Family Medicine

## 2016-02-23 ENCOUNTER — Telehealth: Payer: Self-pay | Admitting: Family Medicine

## 2016-02-23 NOTE — Telephone Encounter (Signed)
Letter written

## 2016-02-23 NOTE — Telephone Encounter (Signed)
If you could do a letter releasing him to full duty, that would be great.  Thanks so much.

## 2016-04-20 ENCOUNTER — Encounter: Payer: Self-pay | Admitting: Family Medicine

## 2016-04-20 ENCOUNTER — Ambulatory Visit (INDEPENDENT_AMBULATORY_CARE_PROVIDER_SITE_OTHER): Payer: BLUE CROSS/BLUE SHIELD | Admitting: Family Medicine

## 2016-04-20 DIAGNOSIS — R591 Generalized enlarged lymph nodes: Secondary | ICD-10-CM | POA: Diagnosis not present

## 2016-04-20 DIAGNOSIS — R599 Enlarged lymph nodes, unspecified: Secondary | ICD-10-CM | POA: Insufficient documentation

## 2016-04-20 MED ORDER — DOXYCYCLINE HYCLATE 100 MG PO TABS: 100.0000 mg | ORAL_TABLET | Freq: Two times a day (BID) | ORAL | 0 refills | Status: DC

## 2016-04-20 MED ORDER — CEFUROXIME AXETIL 500 MG PO TABS: 500.0000 mg | ORAL_TABLET | Freq: Two times a day (BID) | ORAL | 0 refills | Status: DC

## 2016-04-20 MED FILL — DOXYCYCLINE 100 MG TABLET: 100 | 7 days supply | Qty: 14 | Fill #0

## 2016-04-20 MED FILL — CEFUROXIME AXETIL 500 MG TA: 500 | 7 days supply | Qty: 14 | Fill #0

## 2016-04-20 NOTE — Progress Notes (Signed)
       Patrick Walls. is a 40 y.o. male who presents to [facility] today for swollen painful cyst. Patient returns to clinic today with a swollen painful cyst on the right posterior neck. This is the same area that became infected previously. Previously attempted an incision and removal of what I suspected was a sebaceous cyst however became clear that this is probably enlarged lymph node and I discontinue the procedure. Since then he's had now 2 infections in this area that responded to antibiotics. He notes worsening redness pain and swelling over the last few days. He denies any fevers chills nausea vomiting or diarrhea.   No past medical history on file. No past surgical history on file. Social History  Substance Use Topics  . Smoking status: Current Every Day Smoker    Packs/day: 0.50  . Smokeless tobacco: Not on file  . Alcohol use Yes   family history includes Heart failure in his father.  ROS as above:  Medications: Current Outpatient Prescriptions  Medication Sig Dispense Refill  . cefUROXime (CEFTIN) 500 MG tablet Take 1 tablet (500 mg total) by mouth 2 (two) times daily with a meal. 14 tablet 0  . doxycycline (VIBRA-TABS) 100 MG tablet Take 1 tablet (100 mg total) by mouth 2 (two) times daily. 14 tablet 0   No current facility-administered medications for this visit.    Allergies  Allergen Reactions  . Paxil [Paroxetine]      Exam:  BP (!) 137/92   Pulse 86   Temp 97.6 F (36.4 C) (Oral)   Wt 220 lb (99.8 kg)   BMI 31.57 kg/m  Gen: Well NAD Nontoxic appearing Skin: 1 cm erythematous indurated tender lesion right posterior neck with no fluctuance palpated.  No results found for this or any previous visit (from the past 24 hour(s)). No results found.    Assessment and Plan: 40 y.o. male with infected enlarged lymph node. Treat with dual  antibiotic therapy. Refer to general surgery for evaluation of possible excisional biopsy/removal.   Orders Placed This Encounter  Procedures  . Ambulatory referral to General Surgery    Referral Priority:   Routine    Referral Type:   Surgical    Referral Reason:   Specialty Services Required    Requested Specialty:   General Surgery    Number of Visits Requested:   1    Discussed warning signs or symptoms. Please see discharge instructions. Patient expresses understanding.

## 2016-04-20 NOTE — Patient Instructions (Signed)
Thank you for coming in today. Take both antibiotics. Return if worsening. Follow-up with [facility] surgery for evaluation of removal of lymph node

## 2016-05-09 ENCOUNTER — Ambulatory Visit: Payer: BLUE CROSS/BLUE SHIELD | Admitting: Primary Care

## 2016-05-17 ENCOUNTER — Telehealth: Payer: Self-pay

## 2016-05-17 DIAGNOSIS — M5442 Lumbago with sciatica, left side: Secondary | ICD-10-CM

## 2016-05-17 NOTE — Telephone Encounter (Signed)
Epidural steroid injection ordered

## 2016-05-24 ENCOUNTER — Ambulatory Visit
Admission: RE | Admit: 2016-05-24 | Discharge: 2016-05-24 | Disposition: A | Discharge status: Home / Self Care | Payer: BLUE CROSS/BLUE SHIELD | Source: Ambulatory Visit | Attending: Family Medicine | Admitting: Family Medicine

## 2016-05-24 VITALS — BP 132/75 | HR 94

## 2016-05-24 DIAGNOSIS — M5442 Lumbago with sciatica, left side: Secondary | ICD-10-CM

## 2016-05-24 MED ORDER — METHYLPREDNISOLONE ACETATE 40 MG/ML INJ SUSP (RADIOLOG
120.0000 mg | Freq: Once | INTRAMUSCULAR | Status: AC
  Administered 2016-05-24: 120 mg via EPIDURAL

## 2016-05-24 MED ORDER — IOPAMIDOL (ISOVUE-M 200) INJECTION 41%
1.0000 mL | Freq: Once | INTRAMUSCULAR | Status: AC
  Administered 2016-05-24: 1 mL via EPIDURAL

## 2016-05-24 NOTE — Discharge Instructions (Signed)

## 2017-03-17 ENCOUNTER — Emergency Department
Admission: EM | Admit: 2017-03-17 | Discharge: 2017-03-17 | Disposition: A | Discharge status: Home / Self Care | Payer: 59 | Source: Home / Self Care | Attending: Family Medicine | Admitting: Family Medicine

## 2017-03-17 ENCOUNTER — Encounter: Payer: Self-pay | Admitting: Emergency Medicine

## 2017-03-17 DIAGNOSIS — L03221 Cellulitis of neck: Secondary | ICD-10-CM | POA: Diagnosis not present

## 2017-03-17 DIAGNOSIS — R599 Enlarged lymph nodes, unspecified: Secondary | ICD-10-CM

## 2017-03-17 MED ORDER — DOXYCYCLINE HYCLATE 100 MG PO TABS: 100.0000 mg | ORAL_TABLET | Freq: Two times a day (BID) | ORAL | 0 refills | Status: DC

## 2017-03-17 MED ORDER — CEFUROXIME AXETIL 500 MG PO TABS: 500.0000 mg | ORAL_TABLET | Freq: Two times a day (BID) | ORAL | 0 refills | Status: DC

## 2017-03-17 NOTE — ED Triage Notes (Signed)
Patient presents to [facility] with C/O a questionable skin infection. He states he had a small area appear on the right side of his neck and he feels it could be an infection. He also states tat he had the exact same situation 14 months ago and Dr. Georgina Snell lanced it. He developed a severe infection and was on antibiotic.

## 2017-03-17 NOTE — ED Provider Notes (Signed)
CSN: [id number]     Arrival date & time 03/17/17  0905 History   First MD Initiated Contact with Patient 03/17/17 [phone]     Chief Complaint  Patient presents with  . Skin Problem   (Consider location/radiation/quality/duration/timing/severity/associated sxs/prior Treatment) HPI Patrick Keegen Heffern. is a 41 y.o. male presenting to UC with c/o questionable recurrent skin infection on the back of neck on Right side that has gradually worsened over the last 1 week.  He reports having a small tender nodule in the same spot about 14 months ago. It was initially thought to be an infected cyst, I&D was attempted w/o success. Area became reddened and more swollen, ultimately treated with Ceftin and Doxycycline.  Denies fever, chills, n/v/d. Denies recent injury to that are or illness. No ear pain.    History reviewed. No pertinent past medical history. History reviewed. No pertinent surgical history. Family History  Problem Relation Age of Onset  . Heart failure Father    Social History  Substance Use Topics  . Smoking status: Former Smoker    Packs/day: 0.50  . Smokeless tobacco: Never Used  . Alcohol use No    Review of Systems  Constitutional: Negative for chills and fever.  HENT: Negative for ear pain and sore throat.   Musculoskeletal: Positive for neck pain. Negative for back pain and neck stiffness.  Skin: Negative for color change and wound.  Neurological: Negative for dizziness, light-headedness and headaches.    Allergies  Paxil [paroxetine]  Home Medications   Prior to Admission medications   Medication Sig Start Date End Date Taking? Authorizing Provider  cefUROXime (CEFTIN) 500 MG tablet Take 1 tablet (500 mg total) by mouth 2 (two) times daily with a meal. 03/17/17   Noe Gens, PA-C  doxycycline (VIBRA-TABS) 100 MG tablet Take 1 tablet (100 mg total) by mouth 2 (two) times daily. 03/17/17   Noe Gens, PA-C   Meds Ordered and Administered this Visit    Medications - No data to display  BP 119/81 (BP Location: Left Arm)   Pulse 75   Temp 98.2 F (36.8 C) (Oral)   Resp 16   Ht 5' 9"  (1.753 m)   Wt 216 lb 8 oz (98.2 kg)   SpO2 97%   BMI 31.97 kg/m  No data found.   Physical Exam  Constitutional: He is oriented to person, place, and time. He appears well-developed and well-nourished.  HENT:  Head: Normocephalic and atraumatic.    Pea-sized nodule, flesh colored with overlying well healed scar c/w prior I&D. Tender. Mobile. No fluctuance or red streaking.   Eyes: EOM are normal.  Neck: Normal range of motion.  Cardiovascular: Normal rate.   Pulmonary/Chest: Effort normal.  Musculoskeletal: Normal range of motion.  Neurological: He is alert and oriented to person, place, and time.  Skin: Skin is warm and dry.  Psychiatric: He has a normal mood and affect. His behavior is normal.  Nursing note and vitals reviewed.   Urgent Care Course     Procedures (including critical care time)  Labs Review Labs Reviewed - No data to display  Imaging Review No results found.  MDM   1. Cellulitis, neck   2. Enlarged lymph node    Medical records reviewed. Questionable infected lymph node.  Will hold off on I&D at this time as it appears to have worsened symptoms last year.   Encouraged trying antibiotics and warm compresses for 1 week Rx: Ceftin and Doxycycline F/u with  PCP, may need referral to dermatology or general surgery for possible biopsy and/or removal.    Noe Gens, PA-C 03/17/17 1500

## 2017-05-31 ENCOUNTER — Emergency Department (HOSPITAL_BASED_OUTPATIENT_CLINIC_OR_DEPARTMENT_OTHER)
Admission: EM | Admit: 2017-05-31 | Discharge: 2017-05-31 | Disposition: A | Discharge status: Home / Self Care | Payer: 59 | Attending: Emergency Medicine | Admitting: Emergency Medicine

## 2017-05-31 ENCOUNTER — Emergency Department (HOSPITAL_BASED_OUTPATIENT_CLINIC_OR_DEPARTMENT_OTHER): Payer: 59

## 2017-05-31 ENCOUNTER — Encounter (HOSPITAL_BASED_OUTPATIENT_CLINIC_OR_DEPARTMENT_OTHER): Payer: Self-pay | Admitting: *Deleted

## 2017-05-31 DIAGNOSIS — Z79899 Other long term (current) drug therapy: Secondary | ICD-10-CM | POA: Insufficient documentation

## 2017-05-31 DIAGNOSIS — F17200 Nicotine dependence, unspecified, uncomplicated: Secondary | ICD-10-CM | POA: Diagnosis not present

## 2017-05-31 DIAGNOSIS — R079 Chest pain, unspecified: Secondary | ICD-10-CM | POA: Diagnosis not present

## 2017-05-31 DIAGNOSIS — R030 Elevated blood-pressure reading, without diagnosis of hypertension: Secondary | ICD-10-CM | POA: Diagnosis not present

## 2017-05-31 LAB — BASIC METABOLIC PANEL
ANION GAP: 8 (ref 5–15)
BUN: 11 mg/dL (ref 6–20)
CHLORIDE: 105 mmol/L (ref 101–111)
CO2: 24 mmol/L (ref 22–32)
Calcium: 9.6 mg/dL (ref 8.9–10.3)
Creatinine, Ser: 0.84 mg/dL (ref 0.61–1.24)
GFR calc Af Amer: >60 mL/min (ref >60)
Glucose, Bld: 101 mg/dL — ABNORMAL HIGH (ref 65–99)
POTASSIUM: 4 mmol/L (ref 3.5–5.1)
SODIUM: 137 mmol/L (ref 135–145)

## 2017-05-31 LAB — CBG MONITORING, ED: GLUCOSE-CAPILLARY: 99 mg/dL (ref 65–99)

## 2017-05-31 LAB — CBC
HCT: 44.5 % (ref 39.0–52.0)
HEMOGLOBIN: 15 g/dL (ref 13.0–17.0)
MCH: 31.1 pg (ref 26.0–34.0)
MCHC: 33.7 g/dL (ref 30.0–36.0)
MCV: 92.1 fL (ref 78.0–100.0)
Platelets: 300 10*3/uL (ref 150–400)
RBC: 4.83 MIL/uL (ref 4.22–5.81)
RDW: 12.5 % (ref 11.5–15.5)
WBC: 6.1 10*3/uL (ref 4.0–10.5)

## 2017-05-31 LAB — TROPONIN I

## 2017-05-31 MED ORDER — ASPIRIN 81 MG PO CHEW
324.0000 mg | CHEWABLE_TABLET | Freq: Once | ORAL | Status: AC
  Administered 2017-05-31: 324 mg via ORAL
  Filled 2017-05-31: qty 4

## 2017-05-31 NOTE — Discharge Instructions (Signed)

## 2017-05-31 NOTE — ED Notes (Signed)
Pt on monitor 

## 2017-05-31 NOTE — ED Notes (Signed)
Pt given note for work. Verbalizes understanding to f/u with cardiology as directed. Family present to drive
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
## 2017-05-31 NOTE — ED Triage Notes (Signed)
Pt reports sudden onset chest tightness and left arm "tingling" while driving 2 days. States felt dizzy. Denies n/v. Pt states symptoms resolved in about an hour. Denies pain today but wanted get checked out.

## 2017-07-15 IMAGING — MR MR LUMBAR SPINE W/O CM
4 of 5 series · 26 of 48 positions shown · non-contrast
Comparison: Plain films lumbar spine 01/31/2016.

CLINICAL DATA: Mid and low back pain radiating into the left
buttock, leg lateral left foot 2-3 months. No known injury. Initial
encounter.

EXAM:
MRI LUMBAR SPINE WITHOUT CONTRAST
TECHNIQUE: Multiplanar, multisequence MR imaging of the lumbar spine was
performed. No intravenous contrast was administered.

[Series 2: T2 · sagittal · 4.0mm · 0.81mm/px · 6 of 19 slices shown (1 of 2)]
[im 1/19]
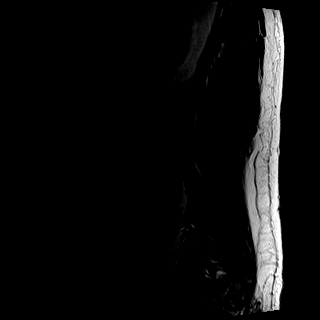
[im 4/19]
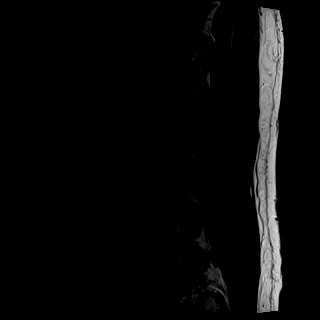
[im 8/19]
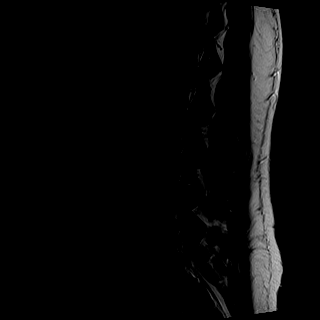
[im 11/19]
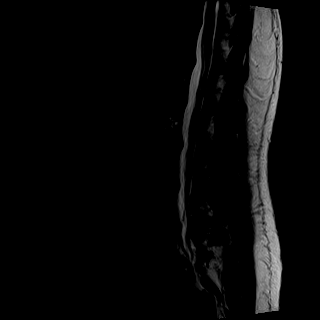
[im 15/19]
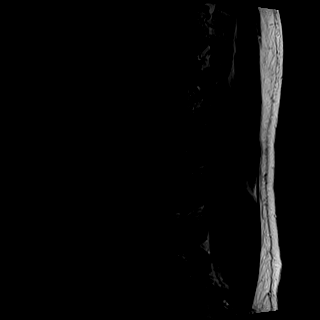
[im 19/19]
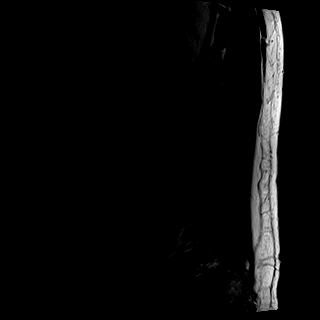

[Series 3: T1 · sagittal · 4.0mm · 0.41mm/px · 6 of 19 slices shown (1 of 2)]
[im 1/19]
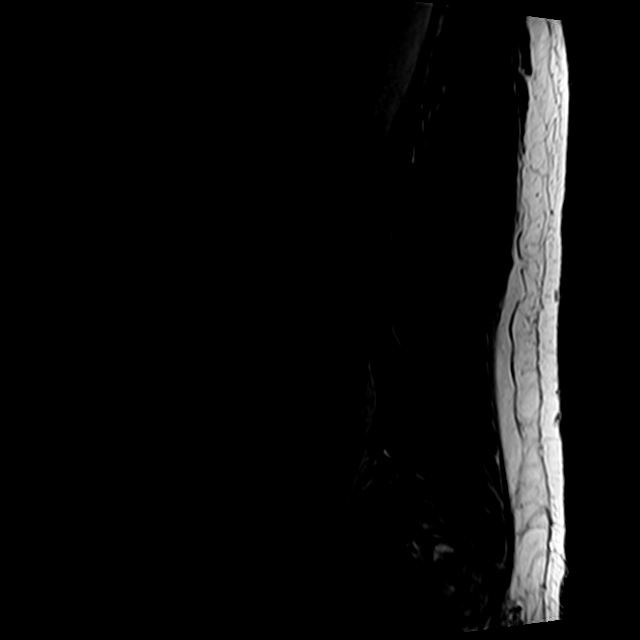
[im 4/19]
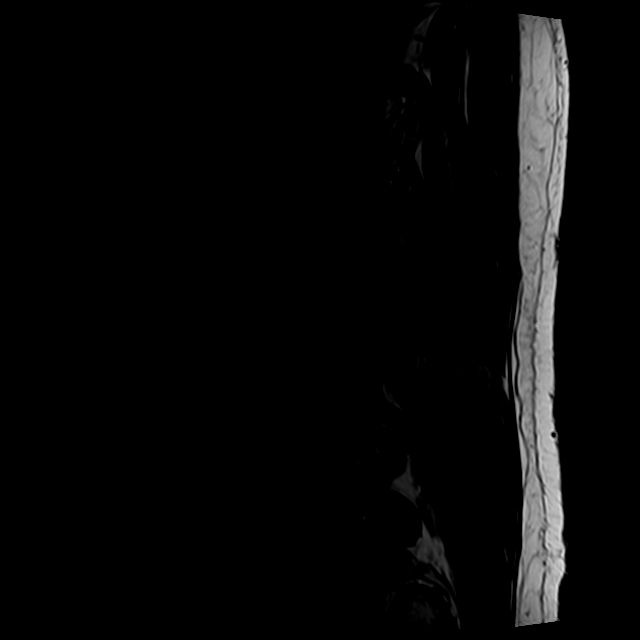
[im 8/19]
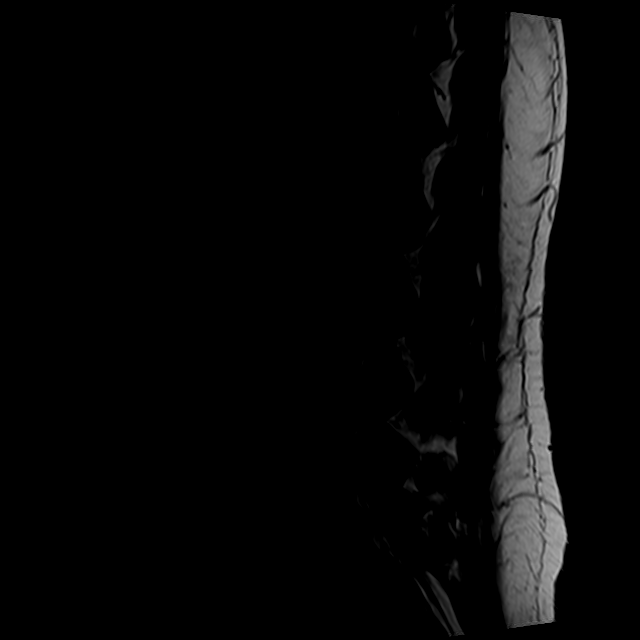
[im 11/19]
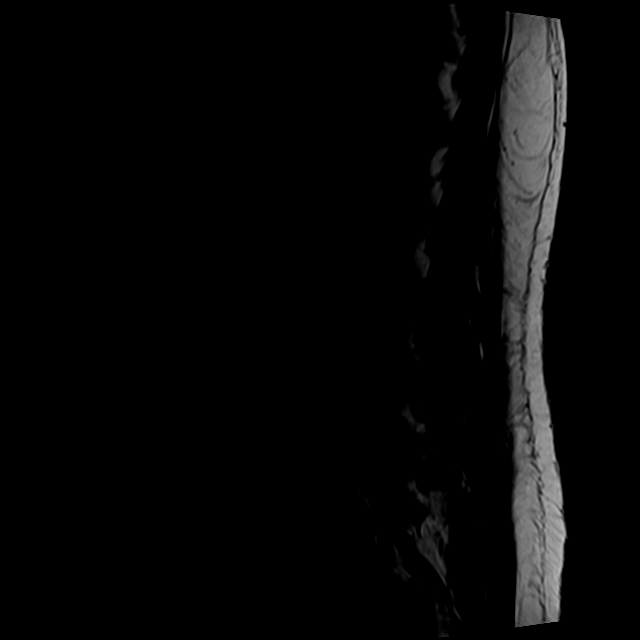
[im 15/19]
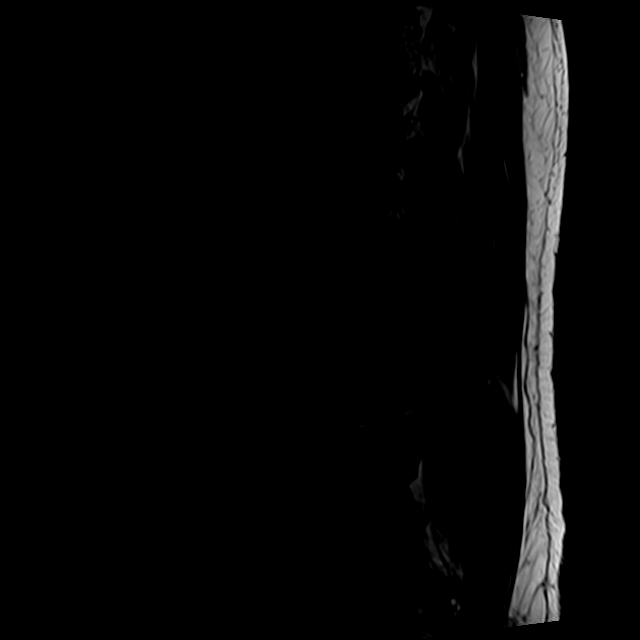
[im 19/19]
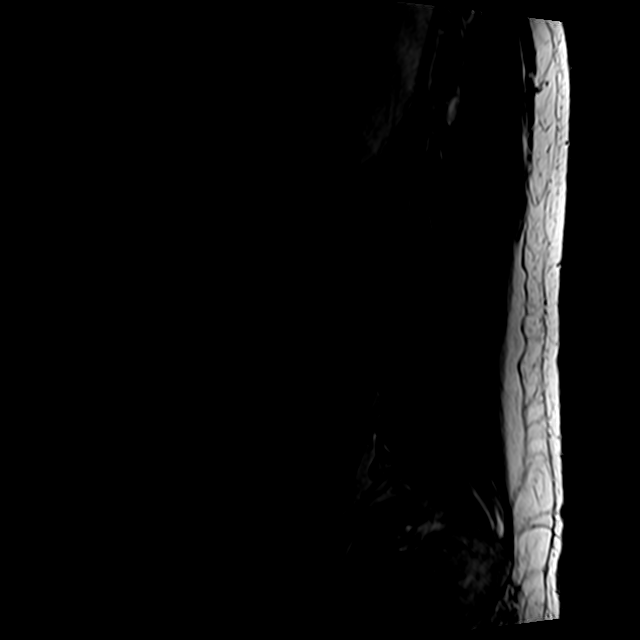

[Series 5: T2 · axial · 4.0mm · 0.78mm/px · z∈[-149,+95]mm · 9 of 48 slices shown (2 of 2)]
[im 1/48]
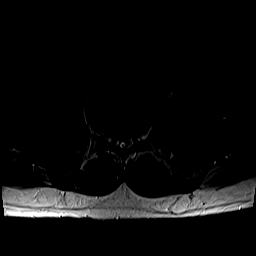
[im 7/48]
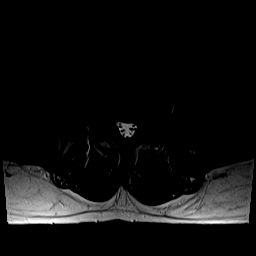
[im 14/48]
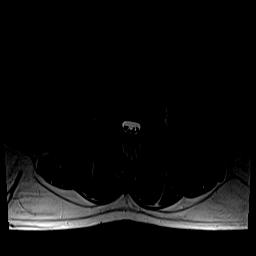
[im 21/48]
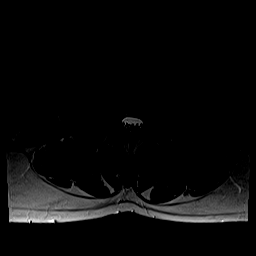
[im 24/48]
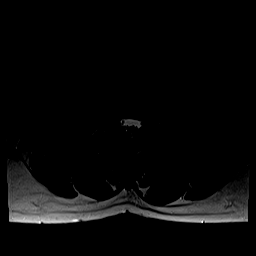
[im 27/48]
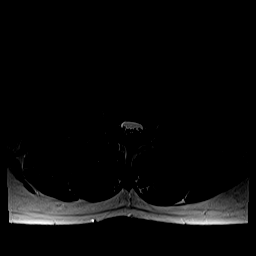
[im 34/48]
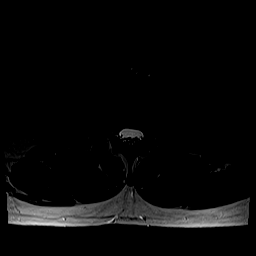
[im 41/48]
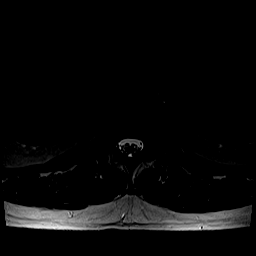
[im 48/48]
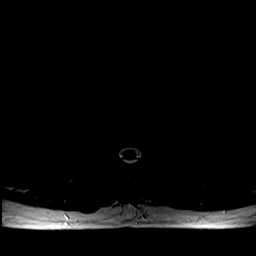

[Series 6: T1 · axial · 4.0mm · 0.39mm/px · z∈[-149,+60]mm · 5 of 48 slices shown (2 of 2)]
[im 1/48]
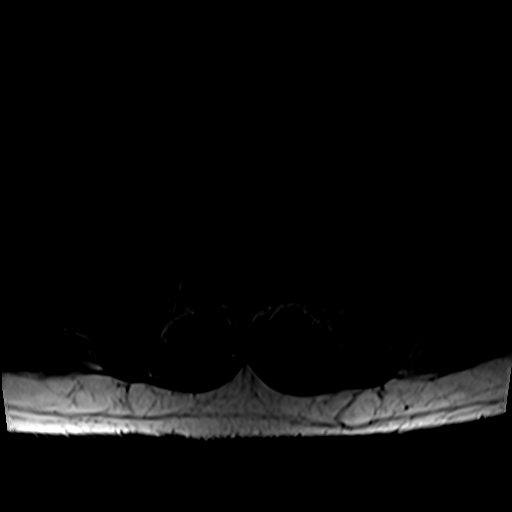
[im 7/48]
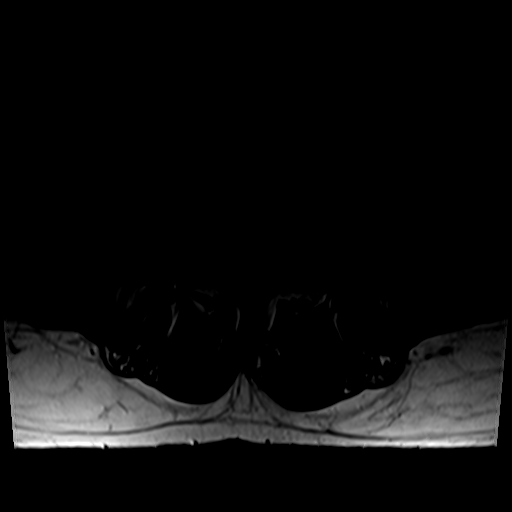
[im 14/48]
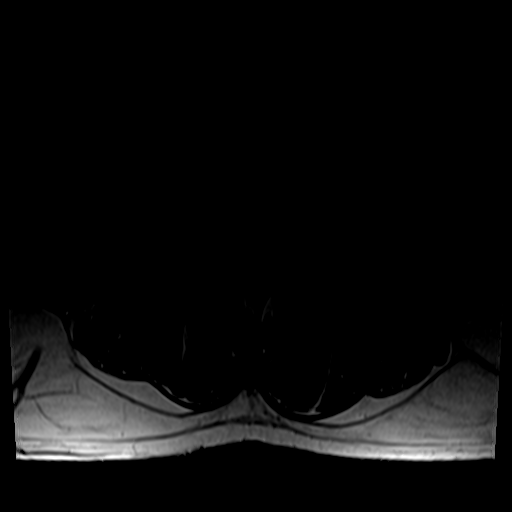
[im 24/48]
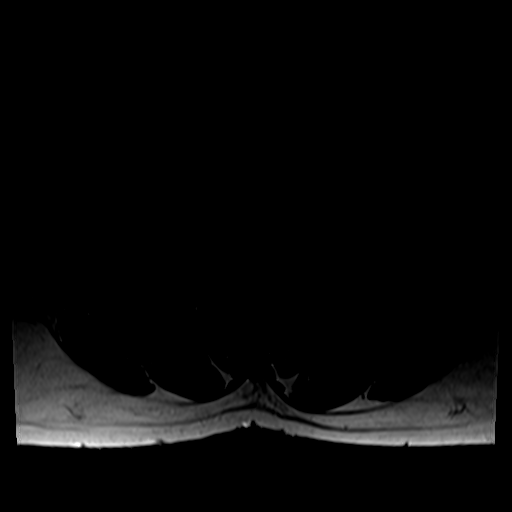
[im 41/48]
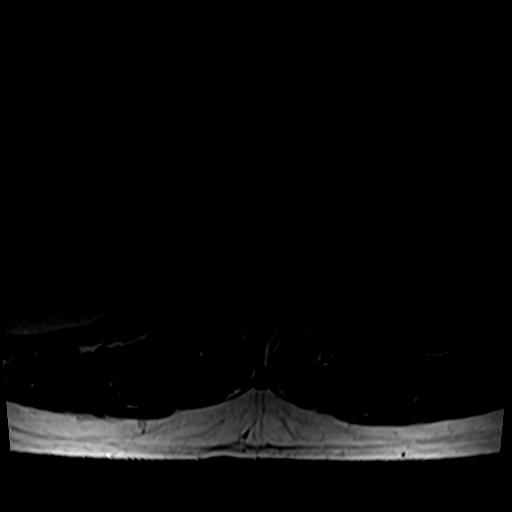

[26 of 48 positions shown; findings below may reference images not displayed]

FINDINGS: Scattered small Schmorl's nodes are noted. There is no fracture.
Alignment is maintained. Prominent venous plexi are seen in L2 and
L3. There is some degenerative endplate signal change at L5. The
conus medullaris is normal in signal and position. Imaged
intra-abdominal contents are unremarkable.

T11-12:  Negative.

T12-L1: Minimal disc bulge without central canal or foraminal
stenosis.

L1-2:  Negative.

L2-3: Very shallow disc bulge without central canal or foraminal
stenosis.

L3-4: Very shallow disc bulge without central canal or foraminal
stenosis.

L4-5: Shallow disc bulge is seen. Mild central canal and bilateral
foraminal narrowing is identified. No nerve root compression.

L5-S1: Shallow disc bulge endplate spur. The central canal is widely
patent. Moderate to moderately severe foraminal narrowing is worse
on the left.
IMPRESSION: Disc and endplate spur result in moderate to moderately severe
bilateral foraminal narrowing at L5-S1, worse on the left.

Shallow disc bulge L4-5 causes mild central canal and bilateral
foraminal narrowing.

## 2017-08-16 ENCOUNTER — Emergency Department (HOSPITAL_BASED_OUTPATIENT_CLINIC_OR_DEPARTMENT_OTHER)
Admission: EM | Admit: 2017-08-16 | Discharge: 2017-08-16 | Disposition: A | Discharge status: Home / Self Care | Payer: 59 | Attending: Emergency Medicine | Admitting: Emergency Medicine

## 2017-08-16 ENCOUNTER — Emergency Department (HOSPITAL_BASED_OUTPATIENT_CLINIC_OR_DEPARTMENT_OTHER): Payer: 59

## 2017-08-16 ENCOUNTER — Other Ambulatory Visit: Payer: Self-pay

## 2017-08-16 ENCOUNTER — Encounter (HOSPITAL_BASED_OUTPATIENT_CLINIC_OR_DEPARTMENT_OTHER): Payer: Self-pay | Admitting: *Deleted

## 2017-08-16 DIAGNOSIS — F17200 Nicotine dependence, unspecified, uncomplicated: Secondary | ICD-10-CM | POA: Insufficient documentation

## 2017-08-16 DIAGNOSIS — Z79899 Other long term (current) drug therapy: Secondary | ICD-10-CM | POA: Insufficient documentation

## 2017-08-16 DIAGNOSIS — M25511 Pain in right shoulder: Secondary | ICD-10-CM | POA: Insufficient documentation

## 2017-08-16 MED ORDER — HYDROCODONE-ACETAMINOPHEN 5-325 MG PO TABS
1.0000 | ORAL_TABLET | Freq: Once | ORAL | Status: AC
  Administered 2017-08-16: 1 via ORAL
  Filled 2017-08-16: qty 1

## 2017-08-16 MED ORDER — NAPROXEN 500 MG PO TABS: 500.0000 mg | ORAL_TABLET | Freq: Two times a day (BID) | ORAL | 0 refills | Status: DC | PRN
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
## 2017-08-16 NOTE — ED Notes (Signed)
Ice was given to patient for comfort.

## 2017-08-16 NOTE — Discharge Instructions (Signed)
Please read instructions below. Apply ice to your shoulder for 20 minutes at a time. You can take naproxen every 12 hours with meals as needed for pain. Wear the sling to comfort and to allow your shoulder to heal. Schedule an appointment with the orthopedic specialist in 1 week to follow up on your injury if symptoms persist. Return to the ER for new or concerning symptoms.

## 2017-08-16 NOTE — ED Triage Notes (Signed)
He tripped and fell today. Pain in his right shoulder.

## 2017-10-09 NOTE — Patient Instructions (Addendum)
Patrick Walls.  10/09/2017   Your procedure is scheduled on: 10-17-17  Report to [facility] Main  Entrance              Report to admitting at      1045AM   Call this number if you have problems the morning of surgery [phone]    Remember: Do not eat food or drink liquids :After Midnight.     Take these medicines the morning of surgery with A SIP OF WATER: NONE                                You may not have any metal on your body including hair pins and              piercings  Do not wear jewelry, , lotions, powders or perfumes, deodorant                       Men may shave face and neck.   Do not bring valuables to the hospital. [place].  Contacts, dentures or bridgework may not be worn into surgery.  Leave suitcase in the car. After surgery it may be brought to your room.                 Please read over the following fact sheets you were given: _____________________________________________________________________           [facility] - Preparing for Surgery Before surgery, you can play an important role.  Because skin is not sterile, your skin needs to be as free of germs as possible.  You can reduce the number of germs on your skin by washing with CHG (chlorahexidine gluconate) soap before surgery.  CHG is an antiseptic cleaner which kills germs and bonds with the skin to continue killing germs even after washing. Please DO NOT use if you have an allergy to CHG or antibacterial soaps.  If your skin becomes reddened/irritated stop using the CHG and inform your nurse when you arrive at Short Stay. Do not shave (including legs and underarms) for at least 48 hours prior to the first CHG shower.  You may shave your face/neck. Please follow these instructions carefully:  1.  Shower with CHG Soap the night before surgery and the  morning of Surgery.  2.  If you choose to wash your hair, wash  your hair first as usual with your  normal  shampoo.  3.  After you shampoo, rinse your hair and body thoroughly to remove the  shampoo.                           4.  Use CHG as you would any other liquid soap.  You can apply chg directly  to the skin and wash                       Gently with a scrungie or clean washcloth.  5.  Apply the CHG Soap to your body ONLY FROM THE NECK DOWN.   Do not use on face/ open  Wound or open sores. Avoid contact with eyes, ears mouth and genitals (private parts).                       Wash face,  Genitals (private parts) with your normal soap.             6.  Wash thoroughly, paying special attention to the area where your surgery  will be performed.  7.  Thoroughly rinse your body with warm water from the neck down.  8.  DO NOT shower/wash with your normal soap after using and rinsing off  the CHG Soap.                9.  Pat yourself dry with a clean towel.            10.  Wear clean pajamas.            11.  Place clean sheets on your bed the night of your first shower and do not  sleep with pets. Day of Surgery : Do not apply any lotions/deodorants the morning of surgery.  Please wear clean clothes to the hospital/surgery center.  FAILURE TO FOLLOW THESE INSTRUCTIONS MAY RESULT IN THE CANCELLATION OF YOUR SURGERY PATIENT SIGNATURE_________________________________  NURSE SIGNATURE__________________________________  ________________________________________________________________________   Adam Phenix  An incentive spirometer is a tool that can help keep your lungs clear and active. This tool measures how well you are filling your lungs with each breath. Taking long deep breaths may help reverse or decrease the chance of developing breathing (pulmonary) problems (especially infection) following:  A long period of time when you are unable to move or be active. BEFORE THE PROCEDURE   If the spirometer includes an  indicator to show your best effort, your nurse or respiratory therapist will set it to a desired goal.  If possible, sit up straight or lean slightly forward. Try not to slouch.  Hold the incentive spirometer in an upright position. INSTRUCTIONS FOR USE  1. Sit on the edge of your bed if possible, or sit up as far as you can in bed or on a chair. 2. Hold the incentive spirometer in an upright position. 3. Breathe out normally. 4. Place the mouthpiece in your mouth and seal your lips tightly around it. 5. Breathe in slowly and as deeply as possible, raising the piston or the ball toward the top of the column. 6. Hold your breath for 3-5 seconds or for as long as possible. Allow the piston or ball to fall to the bottom of the column. 7. Remove the mouthpiece from your mouth and breathe out normally. 8. Rest for a few seconds and repeat Steps 1 through 7 at least 10 times every 1-2 hours when you are awake. Take your time and take a few normal breaths between deep breaths. 9. The spirometer may include an indicator to show your best effort. Use the indicator as a goal to work toward during each repetition. 10. After each set of 10 deep breaths, practice coughing to be sure your lungs are clear. If you have an incision (the cut made at the time of surgery), support your incision when coughing by placing a pillow or rolled up towels firmly against it. Once you are able to get out of bed, walk around indoors and cough well. You may stop using the incentive spirometer when instructed by your caregiver.  RISKS AND COMPLICATIONS  Take your time so you do not get  dizzy or light-headed.  If you are in pain, you may need to take or ask for pain medication before doing incentive spirometry. It is harder to take a deep breath if you are having pain. AFTER USE  Rest and breathe slowly and easily.  It can be helpful to keep track of a log of your progress. Your caregiver can provide you with a simple table  to help with this. If you are using the spirometer at home, follow these instructions: [place] IF:   You are having difficultly using the spirometer.  You have trouble using the spirometer as often as instructed.  Your pain medication is not giving enough relief while using the spirometer.  You develop fever of 100.5 F (38.1 C) or higher. SEEK IMMEDIATE MEDICAL CARE IF:   You cough up bloody sputum that had not been present before.  You develop fever of 102 F (38.9 C) or greater.  You develop worsening pain at or near the incision site. MAKE SURE YOU:   Understand these instructions.  Will watch your condition.  Will get help right away if you are not doing well or get worse. Document Released: 01/15/2007 Document Revised: 11/27/2011 Document Reviewed: 03/18/2007 [facility] Patient Information 2014 [place], [place].   ________________________________________________________________________

## 2017-10-09 NOTE — Progress Notes (Signed)
cxr 05-31-17 epic  ekg 06-04-17 epic

## 2017-10-10 ENCOUNTER — Inpatient Hospital Stay (HOSPITAL_COMMUNITY): Admission: RE | Admit: 2017-10-10 | Payer: 59 | Source: Ambulatory Visit

## 2017-10-11 ENCOUNTER — Encounter (HOSPITAL_COMMUNITY): Payer: Self-pay

## 2017-10-11 ENCOUNTER — Encounter (HOSPITAL_COMMUNITY)
Admission: RE | Admit: 2017-10-11 | Discharge: 2017-10-11 | Disposition: A | Discharge status: Home / Self Care | Payer: 59 | Source: Ambulatory Visit | Attending: Orthopedic Surgery | Admitting: Orthopedic Surgery

## 2017-10-11 ENCOUNTER — Other Ambulatory Visit: Payer: Self-pay

## 2017-10-11 DIAGNOSIS — Z01812 Encounter for preprocedural laboratory examination: Secondary | ICD-10-CM | POA: Diagnosis not present

## 2017-10-11 DIAGNOSIS — Z01818 Encounter for other preprocedural examination: Secondary | ICD-10-CM

## 2017-10-11 DIAGNOSIS — M75101 Unspecified rotator cuff tear or rupture of right shoulder, not specified as traumatic: Secondary | ICD-10-CM | POA: Insufficient documentation

## 2017-10-11 HISTORY — DX: Personal history of other diseases of the digestive system: Z87.19

## 2017-10-11 HISTORY — DX: Gastro-esophageal reflux disease without esophagitis: K21.9

## 2017-10-11 LAB — CBC WITH DIFFERENTIAL/PLATELET
Basophils Absolute: 0.1 10*3/uL (ref 0.0–0.1)
Basophils Relative: 1 %
Eosinophils Absolute: 0.3 10*3/uL (ref 0.0–0.7)
Eosinophils Relative: 4 %
HCT: 45 % (ref 39.0–52.0)
Hemoglobin: 15.4 g/dL (ref 13.0–17.0)
Lymphocytes Relative: 32 %
Lymphs Abs: 2.6 10*3/uL (ref 0.7–4.0)
MCH: 32.2 pg (ref 26.0–34.0)
MCHC: 34.2 g/dL (ref 30.0–36.0)
MCV: 94.1 fL (ref 78.0–100.0)
Monocytes Absolute: 0.5 10*3/uL (ref 0.1–1.0)
Monocytes Relative: 6 %
Neutro Abs: 4.7 10*3/uL (ref 1.7–7.7)
Neutrophils Relative %: 57 %
Platelets: 315 10*3/uL (ref 150–400)
RBC: 4.78 MIL/uL (ref 4.22–5.81)
RDW: 12.5 % (ref 11.5–15.5)
WBC: 8.2 10*3/uL (ref 4.0–10.5)

## 2017-10-11 LAB — COMPREHENSIVE METABOLIC PANEL
ALT: 54 U/L (ref 17–63)
AST: 42 U/L — ABNORMAL HIGH (ref 15–41)
Albumin: 4.7 g/dL (ref 3.5–5.0)
Alkaline Phosphatase: 60 U/L (ref 38–126)
Anion gap: 7 (ref 5–15)
BUN: 13 mg/dL (ref 6–20)
CO2: 27 mmol/L (ref 22–32)
Calcium: 9.8 mg/dL (ref 8.9–10.3)
Chloride: 105 mmol/L (ref 101–111)
Creatinine, Ser: 0.99 mg/dL (ref 0.61–1.24)
GFR calc Af Amer: >60 mL/min (ref >60)
GFR calc non Af Amer: >60 mL/min (ref >60)
Glucose, Bld: 94 mg/dL (ref 65–99)
Potassium: 4 mmol/L (ref 3.5–5.1)
Sodium: 139 mmol/L (ref 135–145)
Total Bilirubin: 0.9 mg/dL (ref 0.3–1.2)
Total Protein: 7.7 g/dL (ref 6.5–8.1)

## 2017-10-11 LAB — URINALYSIS, ROUTINE W REFLEX MICROSCOPIC
Bilirubin Urine: NEGATIVE
Glucose, UA: NEGATIVE mg/dL
Hgb urine dipstick: NEGATIVE
Ketones, ur: NEGATIVE mg/dL
Leukocytes, UA: NEGATIVE
Nitrite: NEGATIVE
Protein, ur: NEGATIVE mg/dL
Specific Gravity, Urine: 1.014 (ref 1.005–1.030)
pH: 8 (ref 5.0–8.0)

## 2017-10-11 LAB — PROTIME-INR
INR: 1.02
Prothrombin Time: 13.3 seconds (ref 11.4–15.2)

## 2017-10-11 LAB — APTT: aPTT: 27 seconds (ref 24–36)

## 2017-10-16 MED ORDER — BUPIVACAINE LIPOSOME 1.3 % IJ SUSP
20.0000 mL | Freq: Once | INTRAMUSCULAR | Status: DC
  Filled 2017-10-16: qty 20

## 2017-10-17 ENCOUNTER — Encounter (HOSPITAL_COMMUNITY): Payer: Self-pay | Admitting: Anesthesiology

## 2017-10-17 ENCOUNTER — Other Ambulatory Visit: Payer: Self-pay

## 2017-10-17 ENCOUNTER — Ambulatory Visit (HOSPITAL_COMMUNITY): Payer: 59 | Admitting: Anesthesiology

## 2017-10-17 ENCOUNTER — Observation Stay (HOSPITAL_COMMUNITY)
Admission: RE | Admit: 2017-10-17 | Discharge: 2017-10-18 | Disposition: A | Discharge status: Home / Self Care | Payer: 59 | Source: Ambulatory Visit | Attending: Orthopedic Surgery | Admitting: Orthopedic Surgery

## 2017-10-17 ENCOUNTER — Encounter (HOSPITAL_COMMUNITY)
Admission: RE | Disposition: A | Discharge status: Home / Self Care | Payer: Self-pay | Source: Ambulatory Visit | Attending: Orthopedic Surgery

## 2017-10-17 DIAGNOSIS — Y939 Activity, unspecified: Secondary | ICD-10-CM | POA: Diagnosis not present

## 2017-10-17 DIAGNOSIS — M7541 Impingement syndrome of right shoulder: Secondary | ICD-10-CM | POA: Diagnosis not present

## 2017-10-17 DIAGNOSIS — K449 Diaphragmatic hernia without obstruction or gangrene: Secondary | ICD-10-CM | POA: Insufficient documentation

## 2017-10-17 DIAGNOSIS — Z888 Allergy status to other drugs, medicaments and biological substances status: Secondary | ICD-10-CM | POA: Diagnosis not present

## 2017-10-17 DIAGNOSIS — M75100 Unspecified rotator cuff tear or rupture of unspecified shoulder, not specified as traumatic: Secondary | ICD-10-CM | POA: Insufficient documentation

## 2017-10-17 DIAGNOSIS — M75101 Unspecified rotator cuff tear or rupture of right shoulder, not specified as traumatic: Secondary | ICD-10-CM | POA: Diagnosis present

## 2017-10-17 DIAGNOSIS — Z79899 Other long term (current) drug therapy: Secondary | ICD-10-CM | POA: Insufficient documentation

## 2017-10-17 DIAGNOSIS — W19XXXA Unspecified fall, initial encounter: Secondary | ICD-10-CM | POA: Diagnosis not present

## 2017-10-17 DIAGNOSIS — K219 Gastro-esophageal reflux disease without esophagitis: Secondary | ICD-10-CM | POA: Diagnosis not present

## 2017-10-17 DIAGNOSIS — F17200 Nicotine dependence, unspecified, uncomplicated: Secondary | ICD-10-CM | POA: Insufficient documentation

## 2017-10-17 DIAGNOSIS — S46011A Strain of muscle(s) and tendon(s) of the rotator cuff of right shoulder, initial encounter: Principal | ICD-10-CM | POA: Insufficient documentation

## 2017-10-17 HISTORY — PX: SHOULDER OPEN ROTATOR CUFF REPAIR: SHX2407

## 2017-10-17 SURGERY — REPAIR, ROTATOR CUFF, OPEN
Anesthesia: General | Site: Shoulder | Laterality: Right

## 2017-10-17 MED ORDER — LACTATED RINGERS IV SOLN
INTRAVENOUS | Status: DC
  Administered 2017-10-17: 18:00:00 via INTRAVENOUS

## 2017-10-17 MED ORDER — METOCLOPRAMIDE HCL 5 MG/ML IJ SOLN: 5.0000 mg | Freq: Three times a day (TID) | INTRAMUSCULAR | Status: DC | PRN

## 2017-10-17 MED ORDER — EPHEDRINE 5 MG/ML INJ
INTRAVENOUS | Status: AC
  Filled 2017-10-17: qty 10

## 2017-10-17 MED ORDER — FENTANYL CITRATE (PF) 100 MCG/2ML IJ SOLN
INTRAMUSCULAR | Status: AC
  Filled 2017-10-17: qty 2

## 2017-10-17 MED ORDER — MIDAZOLAM HCL 2 MG/2ML IJ SOLN
INTRAMUSCULAR | Status: AC
  Administered 2017-10-17: 2 mg via INTRAVENOUS
  Filled 2017-10-17: qty 2

## 2017-10-17 MED ORDER — HYDROMORPHONE HCL 1 MG/ML IJ SOLN: 0.5000 mg | INTRAMUSCULAR | Status: DC | PRN

## 2017-10-17 MED ORDER — PHENOL 1.4 % MT LIQD
1.0000 | OROMUCOSAL | Status: DC | PRN
  Filled 2017-10-17: qty 177

## 2017-10-17 MED ORDER — HYDROMORPHONE HCL 1 MG/ML IJ SOLN: 0.2500 mg | INTRAMUSCULAR | Status: DC | PRN

## 2017-10-17 MED ORDER — PHENYLEPHRINE 40 MCG/ML (10ML) SYRINGE FOR IV PUSH (FOR BLOOD PRESSURE SUPPORT)
PREFILLED_SYRINGE | INTRAVENOUS | Status: DC | PRN
  Administered 2017-10-17: 80 ug via INTRAVENOUS
  Administered 2017-10-17 (×2): 40 ug via INTRAVENOUS
  Administered 2017-10-17: 80 ug via INTRAVENOUS
  Administered 2017-10-17: 40 ug via INTRAVENOUS
  Administered 2017-10-17 (×4): 80 ug via INTRAVENOUS
  Administered 2017-10-17: 40 ug via INTRAVENOUS
  Administered 2017-10-17: 80 ug via INTRAVENOUS

## 2017-10-17 MED ORDER — FENTANYL CITRATE (PF) 100 MCG/2ML IJ SOLN
INTRAMUSCULAR | Status: AC
  Administered 2017-10-17: 100 ug via INTRAVENOUS
  Filled 2017-10-17: qty 2

## 2017-10-17 MED ORDER — MIDAZOLAM HCL 2 MG/2ML IJ SOLN
1.0000 mg | INTRAMUSCULAR | Status: DC
  Administered 2017-10-17: 2 mg via INTRAVENOUS

## 2017-10-17 MED ORDER — FLEET ENEMA 7-19 GM/118ML RE ENEM: 1.0000 | ENEMA | Freq: Once | RECTAL | Status: DC | PRN

## 2017-10-17 MED ORDER — CEFAZOLIN SODIUM-DEXTROSE 1-4 GM/50ML-% IV SOLN
1.0000 g | Freq: Four times a day (QID) | INTRAVENOUS | Status: AC
  Administered 2017-10-17 – 2017-10-18 (×3): 1 g via INTRAVENOUS
  Filled 2017-10-17 (×3): qty 50

## 2017-10-17 MED ORDER — METOCLOPRAMIDE HCL 5 MG PO TABS: 5.0000 mg | ORAL_TABLET | Freq: Three times a day (TID) | ORAL | Status: DC | PRN

## 2017-10-17 MED ORDER — ACETAMINOPHEN 325 MG PO TABS
650.0000 mg | ORAL_TABLET | ORAL | Status: DC | PRN
Start: 2017-10-17 — End: 2017-10-18

## 2017-10-17 MED ORDER — LACTATED RINGERS IV SOLN
INTRAVENOUS | Status: DC
  Administered 2017-10-17 (×3): via INTRAVENOUS

## 2017-10-17 MED ORDER — CEFAZOLIN SODIUM-DEXTROSE 2-4 GM/100ML-% IV SOLN
2.0000 g | INTRAVENOUS | Status: AC
  Administered 2017-10-17: 2 g via INTRAVENOUS
  Filled 2017-10-17: qty 100

## 2017-10-17 MED ORDER — SODIUM CHLORIDE 0.9 % IV SOLN
INTRAVENOUS | Status: DC | PRN
  Administered 2017-10-17: 500 mL

## 2017-10-17 MED ORDER — ACETAMINOPHEN 650 MG RE SUPP: 650.0000 mg | RECTAL | Status: DC | PRN

## 2017-10-17 MED ORDER — MENTHOL 3 MG MT LOZG: 1.0000 | LOZENGE | OROMUCOSAL | Status: DC | PRN

## 2017-10-17 MED ORDER — PROPOFOL 10 MG/ML IV BOLUS
INTRAVENOUS | Status: DC | PRN
  Administered 2017-10-17: 200 mg via INTRAVENOUS

## 2017-10-17 MED ORDER — KETOROLAC TROMETHAMINE 30 MG/ML IJ SOLN: 30.0000 mg | Freq: Once | INTRAMUSCULAR | Status: DC | PRN

## 2017-10-17 MED ORDER — MEPERIDINE HCL 50 MG/ML IJ SOLN: 6.2500 mg | INTRAMUSCULAR | Status: DC | PRN

## 2017-10-17 MED ORDER — ONDANSETRON HCL 4 MG/2ML IJ SOLN: 4.0000 mg | Freq: Four times a day (QID) | INTRAMUSCULAR | Status: DC | PRN

## 2017-10-17 MED ORDER — PROMETHAZINE HCL 25 MG/ML IJ SOLN: 6.2500 mg | INTRAMUSCULAR | Status: DC | PRN

## 2017-10-17 MED ORDER — DEXAMETHASONE SODIUM PHOSPHATE 10 MG/ML IJ SOLN
INTRAMUSCULAR | Status: DC | PRN
  Administered 2017-10-17: 10 mg

## 2017-10-17 MED ORDER — ROPIVACAINE HCL 7.5 MG/ML IJ SOLN
INTRAMUSCULAR | Status: DC | PRN
  Administered 2017-10-17: 20 mL via PERINEURAL

## 2017-10-17 MED ORDER — SUGAMMADEX SODIUM 200 MG/2ML IV SOLN
INTRAVENOUS | Status: AC
  Filled 2017-10-17: qty 2

## 2017-10-17 MED ORDER — EPHEDRINE SULFATE-NACL 50-0.9 MG/10ML-% IV SOSY
PREFILLED_SYRINGE | INTRAVENOUS | Status: DC | PRN
  Administered 2017-10-17 (×4): 5 mg via INTRAVENOUS
  Administered 2017-10-17: 10 mg via INTRAVENOUS
  Administered 2017-10-17 (×4): 5 mg via INTRAVENOUS

## 2017-10-17 MED ORDER — SODIUM CHLORIDE 0.9 % IV SOLN
INTRAVENOUS | Status: AC
  Filled 2017-10-17: qty 500000

## 2017-10-17 MED ORDER — BISACODYL 5 MG PO TBEC
5.0000 mg | DELAYED_RELEASE_TABLET | Freq: Every day | ORAL | Status: DC | PRN
Start: 2017-10-17 — End: 2017-10-18

## 2017-10-17 MED ORDER — ROCURONIUM BROMIDE 10 MG/ML (PF) SYRINGE
PREFILLED_SYRINGE | INTRAVENOUS | Status: DC | PRN
  Administered 2017-10-17: 50 mg via INTRAVENOUS

## 2017-10-17 MED ORDER — PHENYLEPHRINE 40 MCG/ML (10ML) SYRINGE FOR IV PUSH (FOR BLOOD PRESSURE SUPPORT)
PREFILLED_SYRINGE | INTRAVENOUS | Status: AC
  Filled 2017-10-17: qty 10

## 2017-10-17 MED ORDER — HYDROCODONE-ACETAMINOPHEN 10-325 MG PO TABS: 2.0000 | ORAL_TABLET | ORAL | Status: DC | PRN

## 2017-10-17 MED ORDER — LIDOCAINE 2% (20 MG/ML) 5 ML SYRINGE
INTRAMUSCULAR | Status: DC | PRN
  Administered 2017-10-17: 60 mg via INTRAVENOUS

## 2017-10-17 MED ORDER — HYDROCODONE-ACETAMINOPHEN 5-325 MG PO TABS
1.0000 | ORAL_TABLET | ORAL | Status: DC | PRN
  Administered 2017-10-18: 1 via ORAL
  Filled 2017-10-17: qty 1

## 2017-10-17 MED ORDER — ALPRAZOLAM 0.5 MG PO TABS
0.5000 mg | ORAL_TABLET | Freq: Once | ORAL | Status: AC
  Administered 2017-10-17: 0.5 mg via ORAL
  Filled 2017-10-17: qty 1

## 2017-10-17 MED ORDER — POLYETHYLENE GLYCOL 3350 17 G PO PACK: 17.0000 g | PACK | Freq: Every day | ORAL | Status: DC | PRN

## 2017-10-17 MED ORDER — HEMOSTATIC AGENTS (NO CHARGE) OPTIME
TOPICAL | Status: DC | PRN
  Administered 2017-10-17: 1 via TOPICAL

## 2017-10-17 MED ORDER — ONDANSETRON HCL 4 MG/2ML IJ SOLN
INTRAMUSCULAR | Status: DC | PRN
  Administered 2017-10-17: 4 mg via INTRAVENOUS

## 2017-10-17 MED ORDER — ONDANSETRON HCL 4 MG PO TABS: 4.0000 mg | ORAL_TABLET | Freq: Four times a day (QID) | ORAL | Status: DC | PRN

## 2017-10-17 MED ORDER — FENTANYL CITRATE (PF) 100 MCG/2ML IJ SOLN
50.0000 ug | INTRAMUSCULAR | Status: DC
  Administered 2017-10-17 (×4): 50 ug via INTRAVENOUS
  Administered 2017-10-17: 100 ug via INTRAVENOUS

## 2017-10-17 SURGICAL SUPPLY — 55 items
ADH SKN CLS APL DERMABOND .7 (GAUZE/BANDAGES/DRESSINGS) ×1
AGENT HMST SPONGE THK3/8 (HEMOSTASIS) ×1
ATTRACTOMAT 16X20 MAGNETIC DRP (DRAPES) ×2 IMPLANT
BAG SPEC THK2 15X12 ZIP CLS (MISCELLANEOUS)
BAG ZIPLOCK 12X15 (MISCELLANEOUS) IMPLANT
BLADE OSCILLATING/SAGITTAL (BLADE) ×2
BLADE SW THK.38XMED LNG THN (BLADE) ×1 IMPLANT
BNDG COHESIVE 6X5 TAN STRL LF (GAUZE/BANDAGES/DRESSINGS) ×2 IMPLANT
BUR OVAL CARBIDE 4.0 (BURR) ×2 IMPLANT
COVER SURGICAL LIGHT HANDLE (MISCELLANEOUS) ×2 IMPLANT
DERMABOND ADVANCED (GAUZE/BANDAGES/DRESSINGS) ×1
DERMABOND ADVANCED .7 DNX12 (GAUZE/BANDAGES/DRESSINGS) ×1 IMPLANT
DERMASPAN .5-.9MM 4X4CM SHOU (Miscellaneous) ×1 IMPLANT
DRAPE POUCH INSTRU U-SHP 10X18 (DRAPES) ×2 IMPLANT
DRSG AQUACEL AG ADV 3.5X 6 (GAUZE/BANDAGES/DRESSINGS) ×2 IMPLANT
DURAPREP 26ML APPLICATOR (WOUND CARE) ×2 IMPLANT
ELECT BLADE TIP CTD 4 INCH (ELECTRODE) IMPLANT
ELECT REM PT RETURN 15FT ADLT (MISCELLANEOUS) ×2 IMPLANT
GLOVE BIOGEL PI IND STRL 6.5 (GLOVE) ×1 IMPLANT
GLOVE BIOGEL PI IND STRL 7.0 (GLOVE) IMPLANT
GLOVE BIOGEL PI IND STRL 7.5 (GLOVE) IMPLANT
GLOVE BIOGEL PI IND STRL 8.5 (GLOVE) ×1 IMPLANT
GLOVE BIOGEL PI INDICATOR 6.5 (GLOVE) ×1
GLOVE BIOGEL PI INDICATOR 7.0 (GLOVE) ×2
GLOVE BIOGEL PI INDICATOR 7.5 (GLOVE) ×1
GLOVE BIOGEL PI INDICATOR 8.5 (GLOVE) ×1
GLOVE ECLIPSE 8.0 STRL XLNG CF (GLOVE) ×2 IMPLANT
GLOVE SURG SS PI 6.5 STRL IVOR (GLOVE) ×2 IMPLANT
GOWN STRL REUS W/TWL LRG LVL3 (GOWN DISPOSABLE) ×3 IMPLANT
GOWN STRL REUS W/TWL XL LVL3 (GOWN DISPOSABLE) ×2 IMPLANT
HEMOSTAT SPONGE AVITENE ULTRA (HEMOSTASIS) ×2 IMPLANT
KIT BASIN OR (CUSTOM PROCEDURE TRAY) ×2 IMPLANT
KIT POSITION SHOULDER SCHLEI (MISCELLANEOUS) ×2 IMPLANT
MANIFOLD NEPTUNE II (INSTRUMENTS) ×2 IMPLANT
NDL MA TROC 1/2 (NEEDLE) IMPLANT
NEEDLE MA TROC 1/2 (NEEDLE) IMPLANT
PACK SHOULDER (CUSTOM PROCEDURE TRAY) ×2 IMPLANT
POSITIONER SURGICAL ARM (MISCELLANEOUS) ×2 IMPLANT
SLING ARM IMMOBILIZER LRG (SOFTGOODS) ×2 IMPLANT
SPONGE LAP 4X18 X RAY DECT (DISPOSABLE) IMPLANT
STAPLER VISISTAT 35W (STAPLE) IMPLANT
STRIP CLOSURE SKIN 1/2X4 (GAUZE/BANDAGES/DRESSINGS) ×2 IMPLANT
SUCTION FRAZIER 12FR DISP (SUCTIONS) ×2 IMPLANT
SUCTION FRAZIER HANDLE 12FR (TUBING) ×1
SUCTION TUBE FRAZIER 12FR DISP (TUBING) ×1 IMPLANT
SUT BONE WAX W31G (SUTURE) ×2 IMPLANT
SUT ETHIBOND NAB CT1 #1 30IN (SUTURE) ×3 IMPLANT
SUT MNCRL AB 4-0 PS2 18 (SUTURE) ×2 IMPLANT
SUT VIC AB 0 CT1 27 (SUTURE)
SUT VIC AB 0 CT1 27XBRD ANTBC (SUTURE) IMPLANT
SUT VIC AB 1 CT1 27 (SUTURE) ×4
SUT VIC AB 1 CT1 27XBRD ANTBC (SUTURE) ×2 IMPLANT
SUT VIC AB 2-0 CT1 27 (SUTURE) ×6
SUT VIC AB 2-0 CT1 TAPERPNT 27 (SUTURE) ×3 IMPLANT
TOWEL OR 17X26 10 PK STRL BLUE (TOWEL DISPOSABLE) ×2 IMPLANT

## 2017-10-17 NOTE — Progress Notes (Signed)
Assisted Dr. Lissa Hoard with right, ultrasound guided, interscalene  block. Side rails up, monitors on throughout procedure. See vital signs in flow sheet. Tolerated Procedure well.

## 2017-10-17 NOTE — Interval H&P Note (Signed)
History and Physical Interval Note:  10/17/2017 12:44 PM  Patrick Walls.  has presented today for surgery, with the diagnosis of Right shoulder rotator cuff tear  The various methods of treatment have been discussed with the patient and family. After consideration of risks, benefits and other options for treatment, the patient has consented to  Procedure(s): Open right shoulder rotator cuff repair with possible graft and anchors (Right) as a surgical intervention .  The patient's history has been reviewed, patient examined, no change in status, stable for surgery.  I have reviewed the patient's chart and labs.  Questions were answered to the patient's satisfaction.     Latanya Maudlin

## 2017-10-17 NOTE — Anesthesia Postprocedure Evaluation (Signed)
Anesthesia Post Note  Patient: Patrick Walls.  Procedure(s) Performed: Open acromionectomy and right shoulder rotator cuff repair with graft (Right Shoulder)     Patient location during evaluation: PACU Anesthesia Type: General Level of consciousness: sedated and patient cooperative Pain management: pain level controlled Vital Signs Assessment: post-procedure vital signs reviewed and stable Respiratory status: spontaneous breathing Cardiovascular status: stable Anesthetic complications: no    Last Vitals:  Vitals:   10/17/17 1445 10/17/17 1500  BP: 137/84 134/83  Pulse: (!) 103 100  Resp: (!) 27 19  Temp:    SpO2: 96% 94%    Last Pain:  Vitals:   10/17/17 1430  TempSrc:   PainSc: 0-No pain                 Nolon Nations

## 2017-10-17 NOTE — Anesthesia Procedure Notes (Signed)
Anesthesia Regional Block: Interscalene brachial plexus block   Pre-Anesthetic Checklist: ,, timeout performed, Correct Patient, Correct Site, Correct Laterality, Correct Procedure, Correct Position, site marked, Risks and benefits discussed,  Surgical consent,  Pre-op evaluation,  At surgeon's request and post-op pain management  Laterality: Right  Prep: chloraprep       Needles:  Injection technique: Single-shot  Needle Type: Stimulator Needle - 40     Needle Length: 4cm  Needle Gauge: 22     Additional Needles:   Procedures:,,,, ultrasound used (permanent image in chart),,,,  Narrative:  Start time: 10/17/2017 11:57 AM End time: 10/17/2017 12:01 PM Injection made incrementally with aspirations every 5 mL.  Performed by: Personally  Anesthesiologist: Nolon Nations, MD  Additional Notes: BP cuff, EKG monitors applied. Sedation begun. Nerve location verified with U/S. Anesthetic injected incrementally, slowly , and after neg aspirations under direct u/s guidance. Good perineural spread. Tolerated well.

## 2017-10-17 NOTE — H&P (Signed)
Patrick Walls. is an 42 y.o. male.   Chief Complaint: Pain in his right Shoulder and inability to abduct. HPI: Patient fell and injured his right shoulder.  Past Medical History:  Diagnosis Date  . GERD (gastroesophageal reflux disease)   . History of hiatal hernia     Past Surgical History:  Procedure Laterality Date  . ESOPHAGOGASTRODUODENOSCOPY    . NO PAST SURGERIES      Family History  Problem Relation Age of Onset  . Heart failure Father    Social History:  reports that he has been smoking.  He has been smoking about 0.25 packs per day. he has never used smokeless tobacco. He reports that he drinks alcohol. He reports that he does not use drugs.  Allergies:  Allergies  Allergen Reactions  . Paxil [Paroxetine] Rash    Medications Prior to Admission  Medication Sig Dispense Refill  . acetaminophen (TYLENOL) 500 MG tablet Take 500-1,000 mg by mouth every 6 (six) hours as needed (for pain).    Marland Kitchen ibuprofen (ADVIL,MOTRIN) 200 MG tablet Take 400-600 mg by mouth every 8 (eight) hours as needed (for pain.).     Marland Kitchen naproxen (NAPROSYN) 500 MG tablet Take 1 tablet (500 mg total) by mouth 2 (two) times daily as needed for moderate pain. 30 tablet 0  . oxymetazoline (AFRIN) 0.05 % nasal spray Place 1 spray into both nostrils 2 (two) times daily as needed for congestion.      No results found for this or any previous visit (from the past 48 hour(s)). No results found.  Review of Systems  Constitutional: Negative.   HENT: Negative.   Eyes: Negative.   Respiratory: Negative.   Cardiovascular: Negative.   Gastrointestinal: Negative.   Genitourinary: Negative.   Musculoskeletal: Positive for joint pain.  Skin: Negative.   Neurological: Negative.   Endo/Heme/Allergies: Negative.   Psychiatric/Behavioral: Negative.     Blood pressure (!) 143/91, pulse 94, temperature 98.3 F (36.8 C), temperature source Oral, resp. rate 19, height 5' 9"  (1.753 m), weight 100.2 kg (221  lb), SpO2 99 %. Physical Exam  Constitutional: He appears well-developed.  HENT:  Head: Normocephalic.  Eyes: Pupils are equal, round, and reactive to light.  Neck: Normal range of motion.  Cardiovascular: Normal rate.  Respiratory: Effort normal.  GI: Soft.  Genitourinary: Penis normal.  Musculoskeletal: He exhibits tenderness.  Unable to elevate his Right arm  Neurological: He is alert.  Skin: Skin is warm.  Psychiatric: He has a normal mood and affect.     Assessment/Plan Open acromionectomy and Repair of his Right Rotator cull with possible Graft and anchor.  Latanya Maudlin, MD 10/17/2017, 12:36 PM

## 2017-10-17 NOTE — Anesthesia Procedure Notes (Signed)
Procedure Name: Intubation Date/Time: 10/17/2017 1:17 PM Performed by: Lavina Hamman, CRNA Pre-anesthesia Checklist: Patient identified, Emergency Drugs available, Suction available, Patient being monitored and Timeout performed Patient Re-evaluated:Patient Re-evaluated prior to induction Oxygen Delivery Method: Circle system utilized Preoxygenation: Pre-oxygenation with 100% oxygen Induction Type: IV induction Ventilation: Mask ventilation without difficulty Laryngoscope Size: Mac and 4 Grade View: Grade II Tube type: Oral Tube size: 7.5 mm Number of attempts: 1 Airway Equipment and Method: Stylet Placement Confirmation: ETT inserted through vocal cords under direct vision,  positive ETCO2,  CO2 detector and breath sounds checked- equal and bilateral Secured at: 22 cm Tube secured with: Tape Dental Injury: Teeth and Oropharynx as per pre-operative assessment

## 2017-10-17 NOTE — Transfer of Care (Signed)
Immediate Anesthesia Transfer of Care Note  Patient: Patrick Walls.  Procedure(s) Performed: Open acromionectomy and right shoulder rotator cuff repair with graft (Right Shoulder)  Patient Location: PACU  Anesthesia Type:GA combined with regional for post-op pain  Level of Consciousness: awake, alert  and oriented  Airway & Oxygen Therapy: Patient Spontanous Breathing and Patient connected to face mask oxygen  Post-op Assessment: Report given to RN  Post vital signs: Reviewed and stable  Last Vitals:  Vitals:   10/17/17 1244 10/17/17 1245  BP: (!) 128/95   Pulse: 80 99  Resp: (!) 23 (!) 23  Temp:    SpO2: 97% 100%    Last Pain:  Vitals:   10/17/17 1100  TempSrc: Oral         Complications: No apparent anesthesia complications

## 2017-10-17 NOTE — Anesthesia Preprocedure Evaluation (Signed)
Anesthesia Evaluation  Patient identified by MRN, date of birth, ID band Patient awake    Reviewed: Allergy & Precautions, NPO status , Patient's Chart, lab work & pertinent test results  Airway Mallampati: II  TM Distance: >3 FB Neck ROM: Full    Dental no notable dental hx.    Pulmonary neg pulmonary ROS, Current Smoker,    Pulmonary exam normal breath sounds clear to auscultation       Cardiovascular negative cardio ROS Normal cardiovascular exam Rhythm:Regular Rate:Normal     Neuro/Psych negative neurological ROS  negative psych ROS   GI/Hepatic Neg liver ROS, hiatal hernia, GERD  ,  Endo/Other  negative endocrine ROS  Renal/GU negative Renal ROS  negative genitourinary   Musculoskeletal negative musculoskeletal ROS (+)   Abdominal   Peds negative pediatric ROS (+)  Hematology negative hematology ROS (+)   Anesthesia Other Findings   Reproductive/Obstetrics negative OB ROS                            Anesthesia Physical Anesthesia Plan  ASA: II  Anesthesia Plan: General   Post-op Pain Management: GA combined w/ Regional for post-op pain   Induction: Intravenous  PONV Risk Score and Plan: 1 and Ondansetron and Dexamethasone  Airway Management Planned: Oral ETT  Additional Equipment:   Intra-op Plan:   Post-operative Plan: Extubation in OR  Informed Consent: I have reviewed the patients History and Physical, chart, labs and discussed the procedure including the risks, benefits and alternatives for the proposed anesthesia with the patient or authorized representative who has indicated his/her understanding and acceptance.   Dental advisory given  Plan Discussed with: CRNA  Anesthesia Plan Comments:         Anesthesia Quick Evaluation

## 2017-10-17 NOTE — Brief Op Note (Signed)
10/17/2017  2:22 PM  PATIENT:  Patrick Walls.  42 y.o. male  PRE-OPERATIVE DIAGNOSIS:  Right shoulder rotator cuff tear,Complex,Retracted with Impingement.  POST-OPERATIVE DIAGNOSIS:Same as Pre-Op  PROCEDURE:  Procedure(s) with comments: Open acromionectomy and right shoulder rotator cuff repair with graft (Right) - Interscalene Block.Very Complex,Retracted tear  SURGEON:  Surgeon(s) and Role:    * Latanya Maudlin, MD - Primary  PHYSICIAN ASSISTANT: Ardeen Jourdain PA  ASSISTANTS: Ardeen Jourdain PA   ANESTHESIA:   general  EBL:  100 mL   BLOOD ADMINISTERED:none  DRAINS: none   LOCAL MEDICATIONS USED:  OTHER Interscalene Nerve Block  SPECIMEN:  No Specimen  DISPOSITION OF SPECIMEN:  N/A  COUNTS:  YES  TOURNIQUET:  * No tourniquets in log *  DICTATION: .Other Dictation: Dictation Number [id number]  PLAN OF CARE: Admit for overnight observation  PATIENT DISPOSITION:  PACU - hemodynamically stable.   Delay start of Pharmacological VTE agent (>24hrs) due to surgical blood loss or risk of bleeding: yes

## 2017-10-18 ENCOUNTER — Encounter (HOSPITAL_COMMUNITY): Payer: Self-pay | Admitting: Orthopedic Surgery

## 2017-10-18 DIAGNOSIS — S46011A Strain of muscle(s) and tendon(s) of the rotator cuff of right shoulder, initial encounter: Secondary | ICD-10-CM | POA: Diagnosis not present

## 2017-10-18 MED ORDER — HYDROCODONE-ACETAMINOPHEN 5-325 MG PO TABS: 1.0000 | ORAL_TABLET | Freq: Four times a day (QID) | ORAL | 0 refills | Status: DC | PRN

## 2017-10-18 NOTE — Evaluation (Signed)
Occupational Therapy Evaluation Patient Details Name: Patrick Walls. MRN: [id number] DOB: 06-May-1976 Today's Date: 10/18/2017    History of Present Illness S/P R RCR; open acromionectomy   Clinical Impression   This 42 year old man was admitted for the above sx. All education was completed. Pt will follow up with Dr. Gladstone Lighter.     Follow Up Recommendations  DC plan and follow up therapy as arranged by surgeon    Equipment Recommendations  None recommended by OT    Recommendations for Other Services       Precautions / Restrictions Precautions Precautions: Shoulder Type of Shoulder Precautions: conservative protocol; sling on x bathing/dressing, no ROM, no pendulums Precaution Booklet Issued: Yes (comment) Restrictions Weight Bearing Restrictions: Yes Other Position/Activity Restrictions: NWB      Mobility Bed Mobility               General bed mobility comments: min A to get up from bed.  Pt has a recliner he may sleep in  Transfers Overall transfer level: Independent                    Balance                                           ADL either performed or assessed with clinical judgement   ADL Overall ADL's : Needs assistance/impaired Eating/Feeding: Independent   Grooming: Set up;Sitting   Upper Body Bathing: Moderate assistance;Sitting   Lower Body Bathing: Maximal assistance;Sit to/from stand   Upper Body Dressing : Maximal assistance;Sitting   Lower Body Dressing: Maximal assistance;Sit to/from stand   Toilet Transfer: [place] and Hygiene: Modified independent;Sit to/from stand         General ADL Comments: educated on shoulder protocol and performed UB adl.  Wife donned sling/immobilizer. Waist strap was lengthened to accommode pt.  Pt needed cues not to help. Reviewed sleeping/positioning. See education section of chart.     Vision          Perception     Praxis      Pertinent Vitals/Pain Pain Assessment: Faces Faces Pain Scale: Hurts little more Pain Location: R shoulder Pain Descriptors / Indicators: Aching Pain Intervention(s): Limited activity within patient's tolerance;Monitored during session;Repositioned(declined ice)     Hand Dominance     Extremity/Trunk Assessment Upper Extremity Assessment Upper Extremity Assessment: RUE deficits/detail(immobilized; able to move fingers/wrist)           Communication Communication Communication: No difficulties   Cognition Arousal/Alertness: Awake/alert Behavior During Therapy: WFL for tasks assessed/performed Overall Cognitive Status: Within Functional Limits for tasks assessed                                     General Comments       Exercises     Shoulder Instructions      Home Living Family/patient expects to be discharged to:: Private residence Living Arrangements: Spouse/significant other                 Bathroom Shower/Tub: Teacher, early years/pre: Standard                Prior Functioning/Environment Level of Independence: Independent  OT Problem List:        OT Treatment/Interventions:      OT Goals(Current goals can be found in the care plan section) Acute Rehab OT Goals Patient Stated Goal: have a good recovery and do everything I need to do OT Goal Formulation: All assessment and education complete, DC therapy  OT Frequency:     Barriers to D/C:            Co-evaluation              AM-PAC PT "6 Clicks" Daily Activity     Outcome Measure Help from another person eating meals?: None Help from another person taking care of personal grooming?: A Little Help from another person toileting, which includes using toliet, bedpan, or urinal?: A Little Help from another person bathing (including washing, rinsing, drying)?: A Lot Help from another person to put on and  taking off regular upper body clothing?: A Lot Help from another person to put on and taking off regular lower body clothing?: A Lot 6 Click Score: 16   End of Session    Activity Tolerance: Patient tolerated treatment well Patient left: in bed;with call bell/phone within reach;with family/visitor present  OT Visit Diagnosis: Pain Pain - Right/Left: Right Pain - part of body: Shoulder                Time: 1537-9432 OT Time Calculation (min): 46 min Charges:  OT General Charges $OT Visit: 1 Visit OT Evaluation $OT Eval Low Complexity: 1 Low OT Treatments $Self Care/Home Management : 23-37 mins G-Codes:     Lesle Chris, OTR/L [phone] 10/18/2017  Hagan Vanauken 10/18/2017, 9:38 AM

## 2017-10-18 NOTE — Discharge Instructions (Signed)
Keep your sling on at all times, including sleeping in your sling. The only time you should remove your sling is to shower only but you need to keep your hand against your chest while you shower.  Your dressing is waterproof. If the dressing was to become compromised, remove it and replace with gauze and paper tape.  Call Dr. Gladstone Lighter if any wound complications or temperature of 101 degrees F or over.  Call the office for an appointment to see Dr. Gladstone Lighter in two weeks: [phone] and ask for Dr. Charlestine Night nurse, Brunilda Payor.

## 2017-10-18 NOTE — Op Note (Signed)
NAMESERAFIN, DECATUR NO.:  [id number]  MEDICAL RECORD NO.:  [id number]  LOCATION:                                 FACILITY:  PHYSICIAN:  Kipp Brood. Keefe Zawistowski, M.D.DATE OF BIRTH:  1976/01/11  DATE OF PROCEDURE:  10/17/2017 DATE OF DISCHARGE:                              OPERATIVE REPORT   SURGEON:  Kipp Brood. Gladstone Lighter, M.D.  ASSISTANT:  Ardeen Jourdain, [place]  PREOPERATIVE DIAGNOSES: 1. Complete retracted complex tear of the right rotator cuff. 2. Severe impingement syndrome, right shoulder.  POSTOPERATIVE DIAGNOSES: 1. Complete retracted complex tear of the right rotator cuff. 2. Severe impingement syndrome, right shoulder.  OPERATION: 1. Open acromionectomy and acromioplasty of right shoulder. 2. Repair of a complete complex retracted tear of the rotator cuff,     right shoulder. 3. Application of a DermaSpan graft for reinforcement of the repair.  DESCRIPTION OF PROCEDURE:  Under general anesthesia, the patient in the upright position, a routine orthopedic prep and draping of the right shoulder was carried out.  The appropriate time-out was first carried out, and also marked the appropriate right shoulder in the holding area. He had 2 g of IV Ancef.  At this time, incision was made over the anterior aspect of the right shoulder.  I went directly down on the acromion.  I stripped the deltoid tendon by sharp dissection in both direction and then split longitudinally a small portion proximally of the deltoid muscle.  I then went down and noted he had severe impingement.  I protected the underlying area with a Engineer, drilling, utilized an oscillating saw, did a partial acromionectomy.  I then continued to protect the underlying area with a Bennett, now utilized a bur to bur the undersurface of the acromion to even it out.  Once this was done, we thoroughly irrigated out the area and bone waxed the undersurface of the acromion.  I then went down and identified  the tendon.  Noted that there was a marked overgrowth of soft tissue.  This was gently dissected free.  I identified the rotator cuff, brought it forward and sutured it down to the remaining cuff that was still attached.  Note this was a large massive tear described on the MRI. After this, we reinforced the repair with a DermaSpan graft.  No anchors were necessary.  We thoroughly irrigated out the area, reapproximated the deltoid tendon muscle in usual fashion with #1 Ethibond suture, remaining part of the wound was closed in usual fashion with subcuticular suture.  Sterile dressings were applied and a shoulder immobilizer was applied then as well.  The patient left the operating room in satisfactory condition.  He will be kept overnight for pain control. Prior to surgery, Anesthesia gave him an interscalene nerve block.    ______________________________ Kipp Brood. [place], M.D.   ______________________________ Kipp Brood. Gladstone Lighter, M.D.    RAG/MEDQ  D:  10/17/2017  T:  10/17/2017  Job:  [id number]

## 2017-10-18 NOTE — Addendum Note (Signed)
Addendum  created 10/18/17 1033 by Lavina Hamman, CRNA   Intraprocedure Staff edited

## 2017-10-18 NOTE — Discharge Summary (Signed)
Physician Discharge Summary   Patient ID: Patrick Walls. MRN: [id number] DOB/AGE: [birth year] 42 y.o.  Admit date: 10/17/2017 Discharge date: 10/18/2017  Primary Diagnosis: Right shoulder rotator cuff tear  Admission Diagnoses:  Past Medical History:  Diagnosis Date  . GERD (gastroesophageal reflux disease)   . History of hiatal hernia    Discharge Diagnoses:   Active Problems:   Traumatic rotator cuff tear, right, initial encounter  Estimated body mass index is 32.64 kg/m as calculated from the following:   Height as of this encounter: 5' 9"  (1.753 m).   Weight as of this encounter: 100.2 kg (221 lb).  Procedure:  Procedure(s) (LRB): Open acromionectomy and right shoulder rotator cuff repair with graft (Right)   Consults: None  HPI:  Patient developed right shoulder pain and loss of function after falling onto his right shoulder. MRI showed a massive tear of the right rotator cuff.   Laboratory Data: Hospital Outpatient Visit on 10/11/2017  Component Date Value Ref Range Status  . aPTT 10/11/2017 27  24 - 36 seconds Final  . WBC 10/11/2017 8.2  4.0 - 10.5 K/uL Final  . RBC 10/11/2017 4.78  4.22 - 5.81 MIL/uL Final  . Hemoglobin 10/11/2017 15.4  13.0 - 17.0 g/dL Final  . HCT 10/11/2017 45.0  39.0 - 52.0 % Final  . MCV 10/11/2017 94.1  78.0 - 100.0 fL Final  . MCH 10/11/2017 32.2  26.0 - 34.0 pg Final  . MCHC 10/11/2017 34.2  30.0 - 36.0 g/dL Final  . RDW 10/11/2017 12.5  11.5 - 15.5 % Final  . Platelets 10/11/2017 315  150 - 400 K/uL Final  . Neutrophils Relative % 10/11/2017 57  % Final  . Neutro Abs 10/11/2017 4.7  1.7 - 7.7 K/uL Final  . Lymphocytes Relative 10/11/2017 32  % Final  . Lymphs Abs 10/11/2017 2.6  0.7 - 4.0 K/uL Final  . Monocytes Relative 10/11/2017 6  % Final  . Monocytes Absolute 10/11/2017 0.5  0.1 - 1.0 K/uL Final  . Eosinophils Relative 10/11/2017 4  % Final  . Eosinophils Absolute 10/11/2017 0.3  0.0 - 0.7 K/uL Final  . Basophils  Relative 10/11/2017 1  % Final  . Basophils Absolute 10/11/2017 0.1  0.0 - 0.1 K/uL Final  . Sodium 10/11/2017 139  135 - 145 mmol/L Final  . Potassium 10/11/2017 4.0  3.5 - 5.1 mmol/L Final  . Chloride 10/11/2017 105  101 - 111 mmol/L Final  . CO2 10/11/2017 27  22 - 32 mmol/L Final  . Glucose, Bld 10/11/2017 94  65 - 99 mg/dL Final  . BUN 10/11/2017 13  6 - 20 mg/dL Final  . Creatinine, Ser 10/11/2017 0.99  0.61 - 1.24 mg/dL Final  . Calcium 10/11/2017 9.8  8.9 - 10.3 mg/dL Final  . Total Protein 10/11/2017 7.7  6.5 - 8.1 g/dL Final  . Albumin 10/11/2017 4.7  3.5 - 5.0 g/dL Final  . AST 10/11/2017 42* 15 - 41 U/L Final  . ALT 10/11/2017 54  17 - 63 U/L Final  . Alkaline Phosphatase 10/11/2017 60  38 - 126 U/L Final  . Total Bilirubin 10/11/2017 0.9  0.3 - 1.2 mg/dL Final  . GFR calc non Af Amer 10/11/2017 >60  >60 mL/min Final  . GFR calc Af Amer 10/11/2017 >60  >60 mL/min Final   Comment: (NOTE) The eGFR has been calculated using the CKD EPI equation. This calculation has not been validated in all clinical situations. eGFR's persistently <60 mL/min  signify possible Chronic Kidney Disease.   . Anion gap 10/11/2017 7  5 - 15 Final  . Prothrombin Time 10/11/2017 13.3  11.4 - 15.2 seconds Final  . INR 10/11/2017 1.02   Final  . Color, Urine 10/11/2017 YELLOW  YELLOW Final  . APPearance 10/11/2017 CLEAR  CLEAR Final  . Specific Gravity, Urine 10/11/2017 1.014  1.005 - 1.030 Final  . pH 10/11/2017 8.0  5.0 - 8.0 Final  . Glucose, UA 10/11/2017 NEGATIVE  NEGATIVE mg/dL Final  . Hgb urine dipstick 10/11/2017 NEGATIVE  NEGATIVE Final  . Bilirubin Urine 10/11/2017 NEGATIVE  NEGATIVE Final  . Ketones, ur 10/11/2017 NEGATIVE  NEGATIVE mg/dL Final  . Protein, ur 10/11/2017 NEGATIVE  NEGATIVE mg/dL Final  . Nitrite 10/11/2017 NEGATIVE  NEGATIVE Final  . Leukocytes, UA 10/11/2017 NEGATIVE  NEGATIVE Final    Hospital Course: Patrick Mall. is a 42 y.o. who was admitted to [facility]. They were brought to the operating room on 10/17/2017 and underwent Procedure(s): Open acromionectomy and right shoulder rotator cuff repair with graft.  Patient tolerated the procedure well and was later transferred to the recovery room and then to the orthopaedic floor for postoperative care.  They were given PO and IV analgesics for pain control following their surgery.  They were given 24 hours of postoperative antibiotics of  Anti-infectives (From admission, onward)   Start     Dose/Rate Route Frequency Ordered Stop   10/17/17 2000  ceFAZolin (ANCEF) IVPB 1 g/50 mL premix     1 g 100 mL/hr over 30 Minutes Intravenous Every 6 hours 10/17/17 1552 10/18/17 1011   10/17/17 1322  polymyxin B 500,000 Units, bacitracin 50,000 Units in sodium chloride 0.9 % 500 mL irrigation  Status:  Discontinued       As needed 10/17/17 1325 10/17/17 1426   10/17/17 1057  ceFAZolin (ANCEF) IVPB 2g/100 mL premix     2 g 200 mL/hr over 30 Minutes Intravenous On call to O.R. 10/17/17 1057 10/17/17 1313    OT were ordered. Discharge planning consulted to help with postop disposition and equipment needs.  Patient had a fair night on the evening of surgery.  Patient was seen in rounds and was ready to go home.   Diet: Cardiac diet Activity:Wear sling at all times Follow-up:in 7 days Disposition - Home Discharged Condition: stable   Discharge Instructions    Call MD / Call 911   Complete by:  As directed    If you experience chest pain or shortness of breath, CALL 911 and be transported to the hospital emergency room.  If you develope a fever above 101 F, pus (white drainage) or increased drainage or redness at the wound, or calf pain, call your surgeon's office.   Constipation Prevention   Complete by:  As directed    Drink plenty of fluids.  Prune juice may be helpful.  You may use a stool softener, such as Colace (over the counter) 100 mg twice a day.  Use MiraLax (over the counter) for  constipation as needed.   Diet - low sodium heart healthy   Complete by:  As directed    Discharge instructions   Complete by:  As directed    Keep your sling on at all times, including sleeping in your sling. The only time you should remove your sling is to shower only but you need to keep your hand against your chest while you shower.  Your dressing is waterproof. If  the dressing was to become compromised, remove it and replace with gauze and paper tape.  Call Dr. Gladstone Lighter if any wound complications or temperature of 101 degrees F or over.  Call the office for an appointment to see Dr. Gladstone Lighter in two weeks: [phone] and ask for Dr. Charlestine Night nurse, Brunilda Payor.   Increase activity slowly as tolerated   Complete by:  As directed      Allergies as of 10/18/2017      Reactions   Paxil [paroxetine] Rash      Medication List    STOP taking these medications   acetaminophen 500 MG tablet Commonly known as:  TYLENOL   ibuprofen 200 MG tablet Commonly known as:  ADVIL,MOTRIN     TAKE these medications   HYDROcodone-acetaminophen 5-325 MG tablet Commonly known as:  NORCO/VICODIN Take 1-2 tablets by mouth every 6 (six) hours as needed for moderate pain ((score 4 to 6)).   naproxen 500 MG tablet Commonly known as:  NAPROSYN Take 1 tablet (500 mg total) by mouth 2 (two) times daily as needed for moderate pain.   oxymetazoline 0.05 % nasal spray Commonly known as:  AFRIN Place 1 spray into both nostrils 2 (two) times daily as needed for congestion.      Follow-up Information    Latanya Maudlin, MD. Schedule an appointment as soon as possible for a visit in 1 week(s).   Specialty:  Orthopedic Surgery Contact information: [address] [place] [address] [phone]           Signed: Ardeen Jourdain, PA-C Orthopaedic Surgery 10/18/2017, 11:58 AM

## 2017-10-18 NOTE — Progress Notes (Signed)
Subjective: 1 Day Post-Op Procedure(s) (LRB): Open acromionectomy and right shoulder rotator cuff repair with graft (Right) Patient reports pain as 2 on 0-10 scale.  Doing very well today. Will DC.  Objective: Vital signs in last 24 hours: Temp:  [97.8 F (36.6 C)-99.3 F (37.4 C)] 98.3 F (36.8 C) (01/31 0511) Pulse Rate:  [67-128] 97 (01/31 0511) Resp:  [11-30] 16 (01/31 0511) BP: (100-161)/(55-115) 126/66 (01/31 0511) SpO2:  [94 %-100 %] 96 % (01/31 0511) Weight:  [100.2 kg (221 lb)] 100.2 kg (221 lb) (01/30 1150)  Intake/Output from previous day: 01/30 0701 - 01/31 0700 In: 2678.3 [P.O.:350; I.V.:2278.3; IV Piggyback:50] Out: 650 [Urine:550; Blood:100] Intake/Output this shift: No intake/output data recorded.  No results for input(s): HGB in the last 72 hours. No results for input(s): WBC, RBC, HCT, PLT in the last 72 hours. No results for input(s): NA, K, CL, CO2, BUN, CREATININE, GLUCOSE, CALCIUM in the last 72 hours. No results for input(s): LABPT, INR in the last 72 hours.  Neurovascular intact  Assessment/Plan: 1 Day Post-Op Procedure(s) (LRB): Open acromionectomy and right shoulder rotator cuff repair with graft (Right) Discharge home.  Patrick Walls 10/18/2017, 7:23 AM

## 2018-01-25 ENCOUNTER — Emergency Department
Admission: EM | Admit: 2018-01-25 | Discharge: 2018-01-25 | Disposition: A | Discharge status: Home / Self Care | Payer: 59 | Attending: Emergency Medicine | Admitting: Emergency Medicine

## 2018-01-25 ENCOUNTER — Encounter: Payer: Self-pay | Admitting: Emergency Medicine

## 2018-01-25 ENCOUNTER — Other Ambulatory Visit: Payer: Self-pay

## 2018-01-25 ENCOUNTER — Emergency Department: Payer: 59

## 2018-01-25 DIAGNOSIS — N23 Unspecified renal colic: Secondary | ICD-10-CM

## 2018-01-25 DIAGNOSIS — R112 Nausea with vomiting, unspecified: Secondary | ICD-10-CM | POA: Diagnosis not present

## 2018-01-25 DIAGNOSIS — F17210 Nicotine dependence, cigarettes, uncomplicated: Secondary | ICD-10-CM | POA: Diagnosis not present

## 2018-01-25 DIAGNOSIS — R1084 Generalized abdominal pain: Secondary | ICD-10-CM | POA: Diagnosis present

## 2018-01-25 DIAGNOSIS — R8271 Bacteriuria: Secondary | ICD-10-CM

## 2018-01-25 DIAGNOSIS — E876 Hypokalemia: Secondary | ICD-10-CM

## 2018-01-25 LAB — URINALYSIS, COMPLETE (UACMP) WITH MICROSCOPIC
BILIRUBIN URINE: NEGATIVE
GLUCOSE, UA: 50 mg/dL — AB
HGB URINE DIPSTICK: NEGATIVE
Ketones, ur: NEGATIVE mg/dL
LEUKOCYTES UA: NEGATIVE
NITRITE: NEGATIVE
PROTEIN: NEGATIVE mg/dL
Specific Gravity, Urine: 1.014 (ref 1.005–1.030)
Squamous Epithelial / LPF: NONE SEEN (ref 0–5)
WBC, UA: NONE SEEN WBC/hpf (ref 0–5)
pH: 8 (ref 5.0–8.0)

## 2018-01-25 LAB — CBC
HCT: 44.7 % (ref 40.0–52.0)
HEMOGLOBIN: 15 g/dL (ref 13.0–18.0)
MCH: 32 pg (ref 26.0–34.0)
MCHC: 33.6 g/dL (ref 32.0–36.0)
MCV: 95.4 fL (ref 80.0–100.0)
Platelets: 293 10*3/uL (ref 150–440)
RBC: 4.69 MIL/uL (ref 4.40–5.90)
RDW: 12.9 % (ref 11.5–14.5)
WBC: 11.5 10*3/uL — AB (ref 3.8–10.6)

## 2018-01-25 LAB — COMPREHENSIVE METABOLIC PANEL
ALBUMIN: 4.8 g/dL (ref 3.5–5.0)
ALT: 58 U/L (ref 17–63)
ANION GAP: 10 (ref 5–15)
AST: 52 U/L — ABNORMAL HIGH (ref 15–41)
Alkaline Phosphatase: 55 U/L (ref 38–126)
BUN: 14 mg/dL (ref 6–20)
CHLORIDE: 103 mmol/L (ref 101–111)
CO2: 24 mmol/L (ref 22–32)
Calcium: 9.6 mg/dL (ref 8.9–10.3)
Creatinine, Ser: 1.13 mg/dL (ref 0.61–1.24)
GFR calc Af Amer: >60 mL/min (ref >60)
Glucose, Bld: 138 mg/dL — ABNORMAL HIGH (ref 65–99)
POTASSIUM: 3.2 mmol/L — AB (ref 3.5–5.1)
Sodium: 137 mmol/L (ref 135–145)
TOTAL PROTEIN: 7.7 g/dL (ref 6.5–8.1)
Total Bilirubin: 0.3 mg/dL (ref 0.3–1.2)

## 2018-01-25 MED ORDER — POTASSIUM CHLORIDE CRYS ER 20 MEQ PO TBCR
40.0000 meq | EXTENDED_RELEASE_TABLET | Freq: Once | ORAL | Status: AC
  Administered 2018-01-25: 40 meq via ORAL
  Filled 2018-01-25: qty 2

## 2018-01-25 MED ORDER — KETOROLAC TROMETHAMINE 30 MG/ML IJ SOLN
INTRAMUSCULAR | Status: AC
  Administered 2018-01-25: 30 mg via INTRAVENOUS
  Filled 2018-01-25: qty 1

## 2018-01-25 MED ORDER — ONDANSETRON 4 MG PO TBDP: 4.0000 mg | ORAL_TABLET | Freq: Three times a day (TID) | ORAL | 0 refills | Status: DC | PRN

## 2018-01-25 MED ORDER — ONDANSETRON HCL 4 MG/2ML IJ SOLN
4.0000 mg | Freq: Once | INTRAMUSCULAR | Status: AC
  Administered 2018-01-25: 4 mg via INTRAVENOUS

## 2018-01-25 MED ORDER — KETOROLAC TROMETHAMINE 30 MG/ML IJ SOLN
30.0000 mg | Freq: Once | INTRAMUSCULAR | Status: AC
  Administered 2018-01-25: 30 mg via INTRAVENOUS

## 2018-01-25 MED ORDER — HYDROMORPHONE HCL 1 MG/ML IJ SOLN
0.5000 mg | Freq: Once | INTRAMUSCULAR | Status: AC
  Administered 2018-01-25: 0.5 mg via INTRAVENOUS
  Filled 2018-01-25: qty 1

## 2018-01-25 MED ORDER — TAMSULOSIN HCL 0.4 MG PO CAPS: 0.4000 mg | ORAL_CAPSULE | Freq: Every day | ORAL | 0 refills | Status: DC

## 2018-01-25 MED ORDER — OXYCODONE-ACETAMINOPHEN 5-325 MG PO TABS: 1.0000 | ORAL_TABLET | ORAL | 0 refills | Status: AC | PRN

## 2018-01-25 MED ORDER — ONDANSETRON HCL 4 MG/2ML IJ SOLN
INTRAMUSCULAR | Status: AC
  Administered 2018-01-25: 4 mg via INTRAVENOUS
  Filled 2018-01-25: qty 2

## 2018-01-25 MED ORDER — SODIUM CHLORIDE 0.9 % IV BOLUS
1000.0000 mL | Freq: Once | INTRAVENOUS | Status: AC
  Administered 2018-01-25: 1000 mL via INTRAVENOUS

## 2018-01-25 MED ORDER — SULFAMETHOXAZOLE-TRIMETHOPRIM 800-160 MG PO TABS: 1.0000 | ORAL_TABLET | Freq: Two times a day (BID) | ORAL | 0 refills | Status: DC

## 2018-01-25 MED ORDER — KETOROLAC TROMETHAMINE 10 MG PO TABS: 10.0000 mg | ORAL_TABLET | Freq: Three times a day (TID) | ORAL | 0 refills | Status: DC | PRN

## 2018-01-25 MED ORDER — SULFAMETHOXAZOLE-TRIMETHOPRIM 800-160 MG PO TABS
1.0000 | ORAL_TABLET | Freq: Once | ORAL | Status: AC
  Administered 2018-01-25: 1 via ORAL
  Filled 2018-01-25: qty 1

## 2018-01-25 NOTE — ED Triage Notes (Signed)
Pt arrived to the ED accompanied by his family for complaints of right flank pain. Pt states that "the pain came out of no where and that its the worst pain he has ever experienced. Pt is AOx4 in moderate pain distress.

## 2018-01-25 NOTE — Discharge Instructions (Addendum)
Please use the strainer when you urinate; if you capture the kidney stone, bring it with you to the appointment with your urologist.  Please also have your doctor recheck your potassium levels.  You may take Toradol, with food, for mild to moderate pain.  Do not take other NSAID medications including Aleve, Advil, Motrin or ibuprofen when you are taking Toradol.  Percocet is for severe pain.  You may not work or drive within 8 hours of taking Percocet.  Zofran is for nausea and vomiting.  Take the entire 3-day course of Bactrim, even if you are feeling better.  Drink plenty of fluids stay well-hydrated and to help your kidney stone passed.  Return to the emergency department if you develop severe pain, fever, nausea or vomiting that is not improved with medications, or any other symptoms concerning to you.

## 2018-01-25 NOTE — ED Provider Notes (Addendum)
[facility] Emergency Department Provider Note  ____________________________________________  Time seen: Approximately 5:22 PM  I have reviewed the triage vital signs and the nursing notes.   HISTORY  Chief Complaint Back Pain and Flank Pain    HPI Patrick Walls. is a 42 y.o. male with a history of GERD presenting with right flank pain.  The patient reports that 3 hours ago he was sitting when he had the acute onset of right flank pain.  The pain got progressively worse to the point where he had an episode of nausea and vomiting.  The patient denies any fevers or chills, dysuria, hematuria, diarrhea or constipation.  No testicular or scrotal pain; no penile discharge.  He has never had pain like this in the past.  He has tried different positional changes without improvement.  Past Medical History:  Diagnosis Date  . GERD (gastroesophageal reflux disease)   . History of hiatal hernia     Patient Active Problem List   Diagnosis Date Noted  . Traumatic rotator cuff tear, right, initial encounter 10/17/2017  . Enlarged lymph node 04/20/2016  . Lumbago with sciatica, left side 01/03/2016  . HEARTBURN 11/01/2009    Past Surgical History:  Procedure Laterality Date  . ESOPHAGOGASTRODUODENOSCOPY    . NO PAST SURGERIES    . SHOULDER OPEN ROTATOR CUFF REPAIR Right 10/17/2017   Procedure: Open acromionectomy and right shoulder rotator cuff repair with graft;  Surgeon: Latanya Maudlin, MD;  Location: [facility] ORS;  Service: Orthopedics;  Laterality: Right;  Interscalene Block    Current Outpatient Rx  . Order #: [id number] Class: Print  . Order #: [id number] Class: Print  . Order #: [id number] Class: Print  . Order #: [id number] Class: Print  . Order #: [id number] Class: Print  . Order #: [id number] Class: Historical Med  . Order #: [id number] Class: Print    Allergies Paxil [paroxetine]  Family History  Problem Relation Age of Onset  . Heart failure Father      Social History Social History   Tobacco Use  . Smoking status: Current Some Day Smoker    Packs/day: 0.25  . Smokeless tobacco: Never Used  Substance Use Topics  . Alcohol use: Yes    Comment: occasional  . Drug use: No    Review of Systems Constitutional: No fever/chills.  This or syncope. Eyes: No visual changes. ENT: No sore throat. No congestion or rhinorrhea. Cardiovascular: Denies chest pain. Denies palpitations. Respiratory: Denies shortness of breath.  No cough. Gastrointestinal: Positive right flank pain.  No abdominal pain.  Positive nausea, positive vomiting.  No diarrhea.  No constipation. Genitourinary: Negative for dysuria.  No hematuria.  No urinary frequency.  No testicular scrotal pain.  No penile discharge. Musculoskeletal: Negative for midline back pain. Skin: Negative for rash. Neurological: Negative for headaches. No focal numbness, tingling or weakness.     ____________________________________________   PHYSICAL EXAM:  VITAL SIGNS: ED Triage Vitals  Enc Vitals Group     BP 01/25/18 1647 (!) 146/113     Pulse Rate 01/25/18 1647 99     Resp 01/25/18 1647 (!) 22     Temp 01/25/18 1647 98.2 F (36.8 C)     Temp Source 01/25/18 1647 Oral     SpO2 01/25/18 1647 100 %     Weight 01/25/18 1648 225 lb (102.1 kg)     Height 01/25/18 1648 5' 8"  (1.727 m)     Head Circumference --      Peak Flow --  Pain Score 01/25/18 1648 10     Pain Loc --      Pain Edu? --      Excl. in [place]? --     Constitutional: Alert and oriented.  Very uncomfortable appearing but nontoxic. Answers questions appropriately. Eyes: Conjunctivae are normal.  EOMI. No scleral icterus. Head: Atraumatic. Nose: No congestion/rhinnorhea. Mouth/Throat: Mucous membranes are moist.  Neck: No stridor.  Supple.  No JVD.  No meningismus. Cardiovascular: Normal rate, regular rhythm. No murmurs, rubs or gallops.  Respiratory: Normal respiratory effort.  No accessory muscle use or  retractions. Lungs CTAB.  No wheezes, rales or ronchi. Gastrointestinal: Soft, nontender and nondistended.  No reproducible CVA tenderness to palpation.  No guarding or rebound.  No peritoneal signs. Musculoskeletal: No LE edema. Neurologic:  A&Ox3.  Speech is clear.  Face and smile are symmetric.  EOMI.  Moves all extremities well. Skin:  Skin is warm, dry and intact. No rash noted. Psychiatric: Mood and affect are normal. Speech and behavior are normal.  Normal judgement  ____________________________________________   LABS (all labs ordered are listed, but only abnormal results are displayed)  Labs Reviewed  COMPREHENSIVE METABOLIC PANEL - Abnormal; Notable for the following components:      Result Value   Potassium 3.2 (*)    Glucose, Bld 138 (*)    AST 52 (*)    All other components within normal limits  CBC - Abnormal; Notable for the following components:   WBC 11.5 (*)    All other components within normal limits  URINALYSIS, COMPLETE (UACMP) WITH MICROSCOPIC   ____________________________________________  EKG  Not indicated ____________________________________________  RADIOLOGY  Ct Renal Stone Study  Result Date: 01/25/2018 CLINICAL DATA:  Right flank pain x2 hours with vomiting. EXAM: CT ABDOMEN AND PELVIS WITHOUT CONTRAST TECHNIQUE: Multidetector CT imaging of the abdomen and pelvis was performed following the standard protocol without IV contrast. COMPARISON:  None. FINDINGS: Lower chest: Normal heart size. No pericardial effusion. Dependent atelectasis at the lung bases. Hepatobiliary: No focal liver abnormality is seen. No gallstones, gallbladder wall thickening, or biliary dilatation. Pancreas: No definite space-occupying mass given limitations of a noncontrast study. No pancreatic ductal dilatation or surrounding inflammatory changes. Spleen: Normal in size without focal abnormality. Adrenals/Urinary Tract: Normal bilateral adrenal glands. Punctate bilateral  nonobstructing renal calculi, the largest in the lower pole the right kidney measuring approximately 5 x 2 mm. Mild perinephric fat stranding and hydroureteronephrosis is noted on the right secondary to a distal right ureteral 4 mm ureteral calculus that appears to have almost passed completely into the bladder if not already so. The urinary bladder is unremarkable. Stomach/Bowel: Stomach is within normal limits. Appendix appears normal. No evidence of bowel wall thickening, distention, or inflammatory changes. Vascular/Lymphatic: No significant vascular findings are present. No enlarged abdominal or pelvic lymph nodes. Reproductive: Prostate is unremarkable. Other: No abdominal wall hernia or abnormality. No abdominopelvic ascites. Musculoskeletal: Degenerative disc disease L5-S1. No acute nor suspicious osseous abnormalities. IMPRESSION: 1. 4 mm distal right ureteral calculus that appears to have almost passed into the bladder if not already so. 2. Mild right-sided hydroureteronephrosis is identified. 3. Bilateral nonobstructing renal calculi, the largest in the lower pole the right kidney measuring approximately 5 x 2 mm. Electronically Signed   By: Ashley Royalty M.D.   On: 01/25/2018 17:50    ____________________________________________   PROCEDURES  Procedure(s) performed: None  Procedures  Critical Care performed: No ____________________________________________   INITIAL IMPRESSION / ASSESSMENT AND PLAN / ED  COURSE  Pertinent labs & imaging results that were available during my care of the patient were reviewed by me and considered in my medical decision making (see chart for details).  42 y.o. male with acute onset of right flank pain, one episode of nausea and vomiting.  Overall, the patient's clinical history is most suggestive of renal colic.  Musculoskeletal strain is also possible.  Aortic pathology is much less likely given his age.  Intestinal pathology including gas is possible.   There is no evidence for an acute solid organ disorder in the abdomen.  A CT renal stone protocol has been ordered.  The patient has been given intravenous fluids with Toradol and Zofran, as well as narcotic pain medication for pain management.  Plan reevaluation for final disposition.  ----------------------------------------- 6:20 PM on 01/25/2018 -----------------------------------------  The patient's CT scan does show a 4 mm UPJ or stone in the edge of the bladder.  The patient has normal creatinine and mildly elevated white blood cell count at 11.5.  At this time, the patient is completely pain-free and able to tolerate liquids without any difficulty.  The patient is safe for discharge home.  He will follow-up with urology in the next 5 to 7 days.  He has been provided with a urinary strainer.  Return precautions as well as follow-up instructions were discussed.  ----------------------------------------- 6:48 PM on 01/25/2018 -----------------------------------------  The patient has bacteriuria, and given his recent kidney stone, will treat him with a 3-day course of Bactrim.  Urine culture has been sent.  He will also receive supplementation for his hypokalemia and follow-up with his primary care physician for this; the most likely etiology is his vomiting.  ____________________________________________  FINAL CLINICAL IMPRESSION(S) / ED DIAGNOSES  Final diagnoses:  Renal colic on right side         NEW MEDICATIONS STARTED DURING THIS VISIT:  New Prescriptions   KETOROLAC (TORADOL) 10 MG TABLET    Take 1 tablet (10 mg total) by mouth every 8 (eight) hours as needed for moderate pain (with food).   ONDANSETRON (ZOFRAN ODT) 4 MG DISINTEGRATING TABLET    Take 1 tablet (4 mg total) by mouth every 8 (eight) hours as needed for nausea or vomiting.   OXYCODONE-ACETAMINOPHEN (PERCOCET) 5-325 MG TABLET    Take 1 tablet by mouth every 4 (four) hours as needed for severe pain.    TAMSULOSIN (FLOMAX) 0.4 MG CAPS CAPSULE    Take 1 capsule (0.4 mg total) by mouth daily.      Eula Listen, MD 01/25/18 Kathaleen Maser    Eula Listen, MD 01/25/18 [phone]

## 2018-01-27 LAB — URINE CULTURE: CULTURE: NO GROWTH

## 2018-11-13 IMAGING — CR DG CHEST 2V
2 series · 2 of 2 positions shown · non-contrast
Comparison: None.

CLINICAL DATA: Chest pain for 2 days.

EXAM:
CHEST  2 VIEW

[w chest pa]
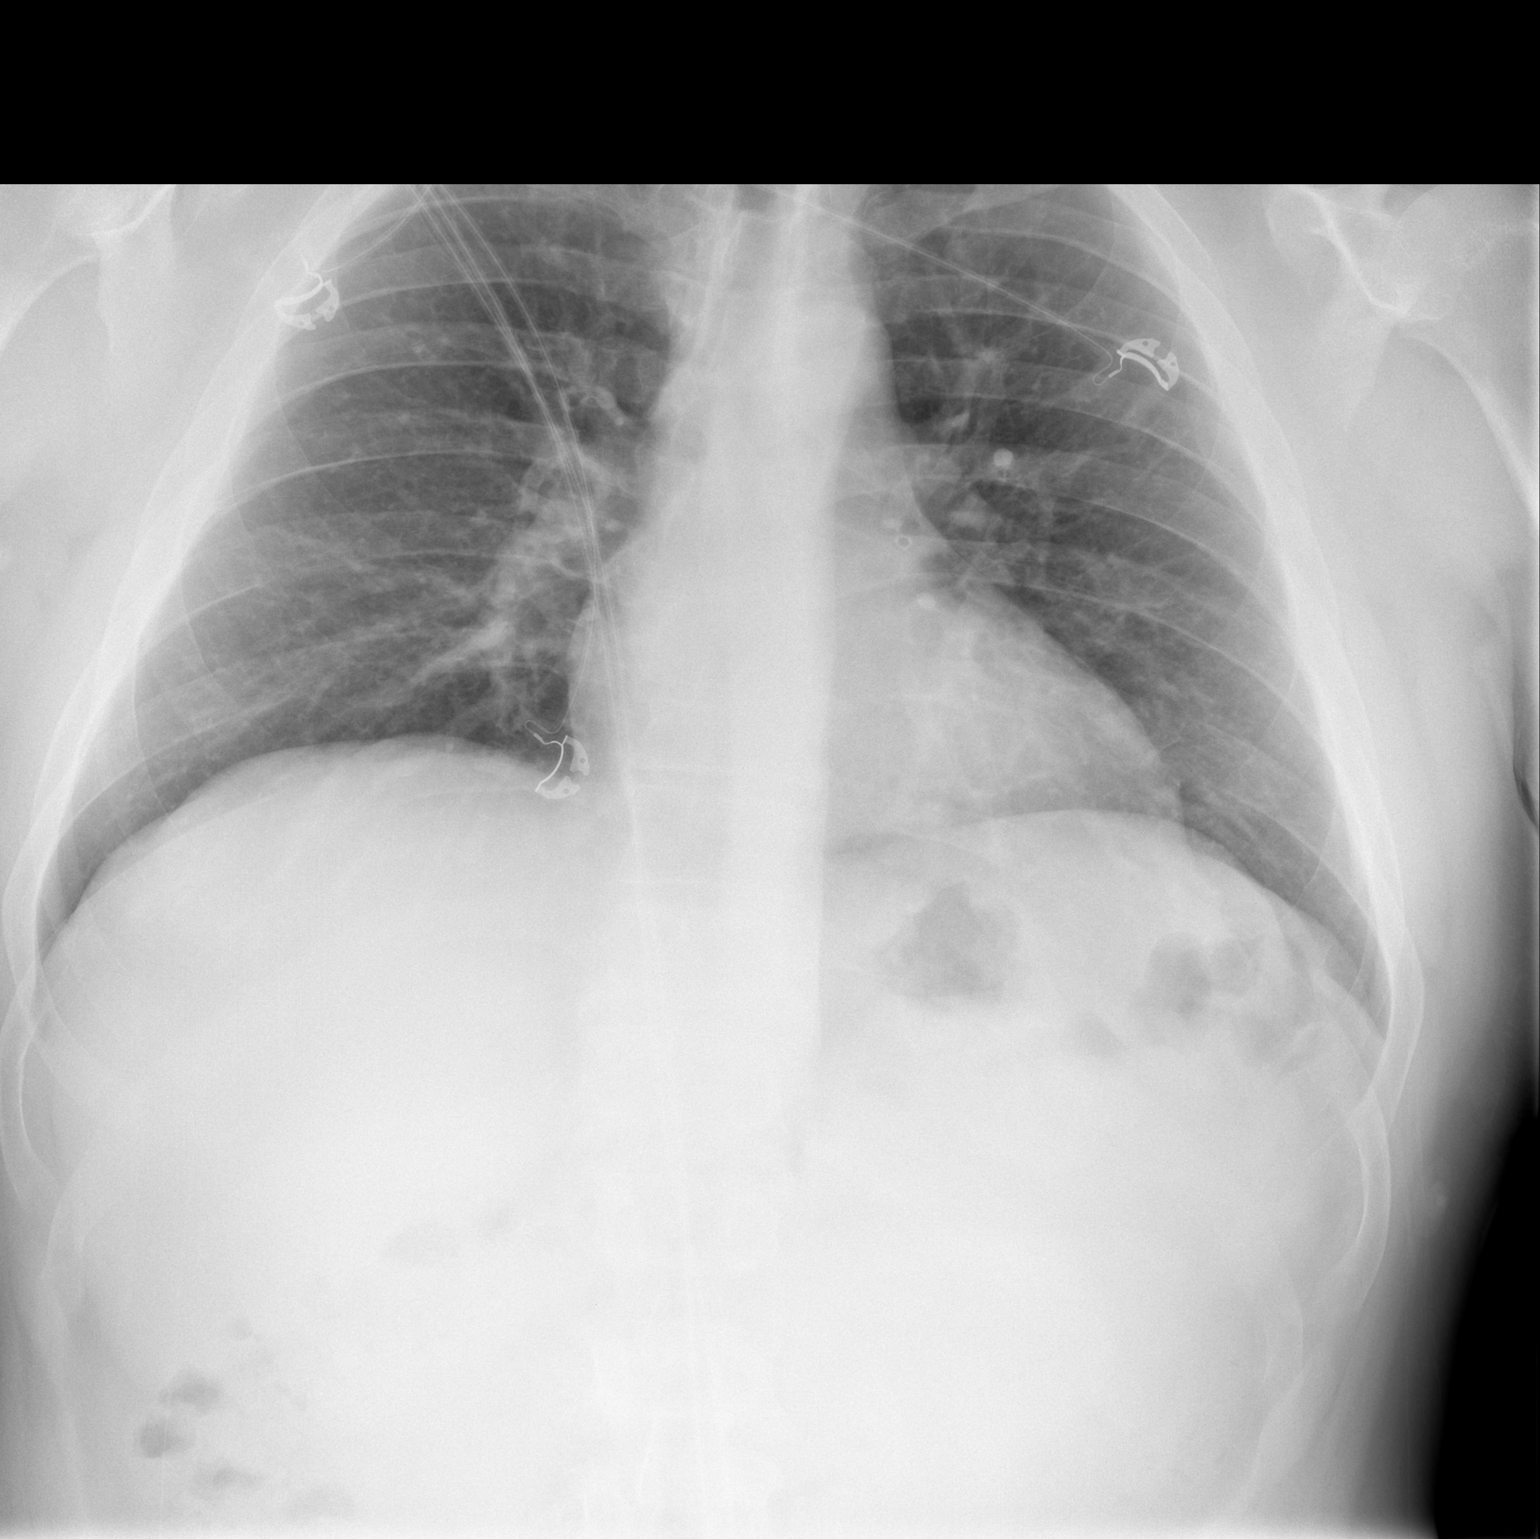

[w chest lat]
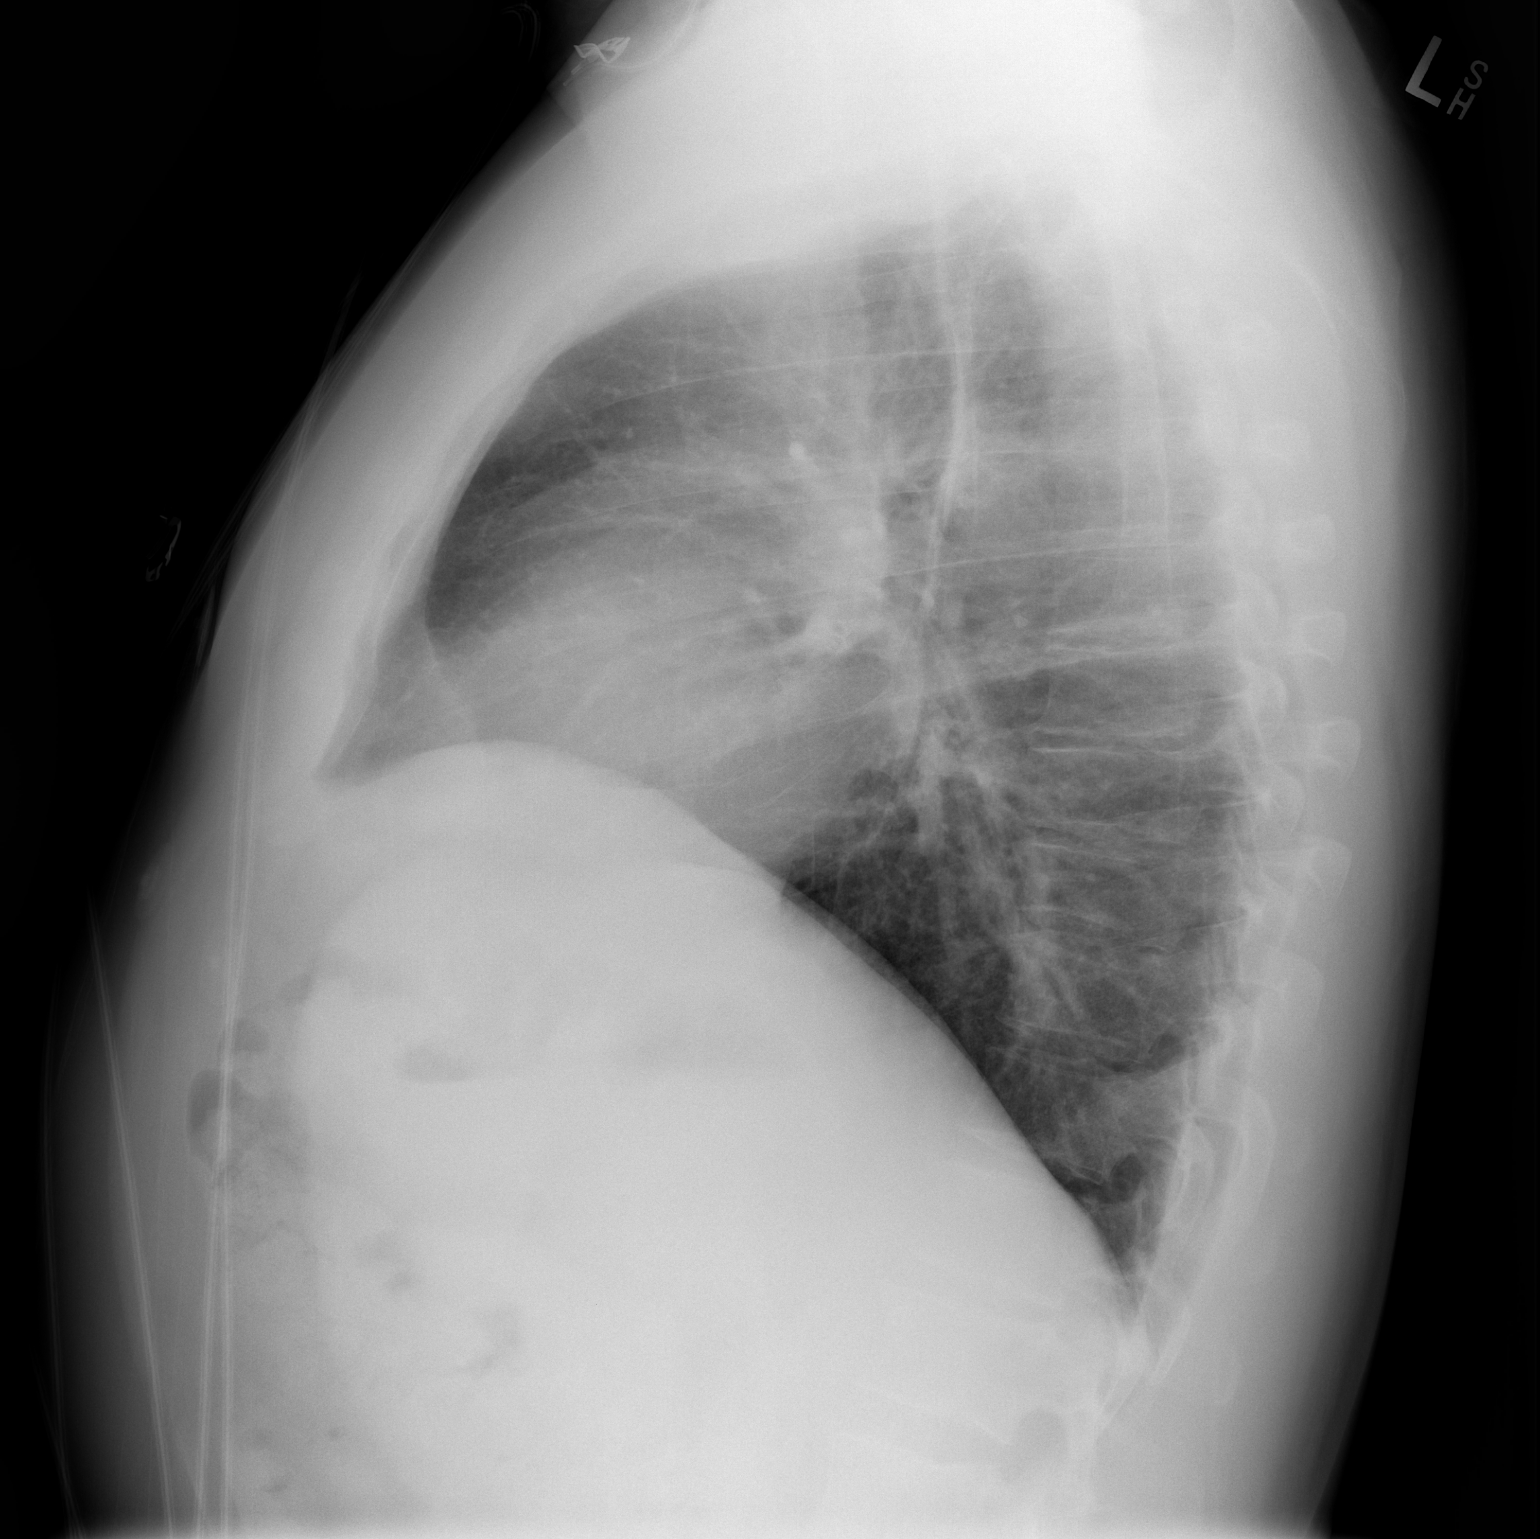

[2 of 2 positions shown; findings below may reference images not displayed]

FINDINGS: The heart size and mediastinal contours are within normal limits.
Both lungs are clear. The visualized skeletal structures are
unremarkable.
IMPRESSION: No active cardiopulmonary disease.

## 2019-07-09 ENCOUNTER — Other Ambulatory Visit: Payer: Self-pay

## 2019-07-09 DIAGNOSIS — Z20822 Contact with and (suspected) exposure to covid-19: Secondary | ICD-10-CM

## 2019-07-11 LAB — NOVEL CORONAVIRUS, NAA: SARS-CoV-2, NAA: NOT DETECTED

## 2020-03-04 ENCOUNTER — Encounter: Payer: Self-pay | Admitting: Emergency Medicine

## 2020-03-04 ENCOUNTER — Ambulatory Visit
Admission: EM | Admit: 2020-03-04 | Discharge: 2020-03-04 | Disposition: A | Discharge status: Home / Self Care | Payer: 59 | Attending: Physician Assistant | Admitting: Physician Assistant

## 2020-03-04 ENCOUNTER — Other Ambulatory Visit: Payer: Self-pay

## 2020-03-04 DIAGNOSIS — N211 Calculus in urethra: Secondary | ICD-10-CM

## 2020-03-04 LAB — POCT URINALYSIS DIP (MANUAL ENTRY)
Bilirubin, UA: NEGATIVE
Glucose, UA: NEGATIVE mg/dL
Ketones, POC UA: NEGATIVE mg/dL
Leukocytes, UA: NEGATIVE
Nitrite, UA: NEGATIVE
Spec Grav, UA: >=1.03 — AB (ref 1.010–1.025)
Urobilinogen, UA: 0.2 E.U./dL
pH, UA: 5.5 (ref 5.0–8.0)

## 2020-03-04 MED ORDER — ONDANSETRON 4 MG PO TBDP: 4.0000 mg | ORAL_TABLET | Freq: Three times a day (TID) | ORAL | 0 refills | Status: DC | PRN

## 2020-03-04 MED ORDER — HYDROCODONE-ACETAMINOPHEN 5-325 MG PO TABS: 1.0000 | ORAL_TABLET | Freq: Three times a day (TID) | ORAL | 0 refills | Status: DC | PRN

## 2020-03-04 MED ORDER — KETOROLAC TROMETHAMINE 10 MG PO TABS: 10.0000 mg | ORAL_TABLET | Freq: Four times a day (QID) | ORAL | 0 refills | Status: DC | PRN

## 2020-03-04 MED ORDER — TAMSULOSIN HCL 0.4 MG PO CAPS: 0.4000 mg | ORAL_CAPSULE | Freq: Every day | ORAL | 0 refills | Status: DC

## 2020-03-04 MED ORDER — KETOROLAC TROMETHAMINE 15 MG/ML IJ SOLN
15.0000 mg | Freq: Once | INTRAMUSCULAR | Status: AC
  Administered 2020-03-04: 15 mg via INTRAMUSCULAR

## 2020-03-04 NOTE — ED Triage Notes (Signed)
Right flank pain started this morning, burning with urination. Producing smaller amounts of urine than usual.   Flank pain is sharp.

## 2020-03-04 NOTE — ED Notes (Signed)
Patient is in rest room, will attempt to collect urine specimen

## 2020-03-04 NOTE — ED Provider Notes (Signed)
[facility]    CSN: [id number] Arrival date & time: 03/04/20  0934      History   Chief Complaint Chief Complaint  Patient presents with  . Abdominal Pain    HPI Patrick Walls. is a 44 y.o. male.   44 year old male comes in for acute onset of right flank pain, dysuria. Has still been able to urinate, though harder to maintain a stream. Denies fever, abdominal pain. Pain causing vomiting, otherwise no nausea. Denies hematuria. H/o kidney stones.     Past Medical History:  Diagnosis Date  . GERD (gastroesophageal reflux disease)   . History of hiatal hernia     Patient Active Problem List   Diagnosis Date Noted  . Traumatic rotator cuff tear, right, initial encounter 10/17/2017  . Enlarged lymph node 04/20/2016  . Lumbago with sciatica, left side 01/03/2016  . HEARTBURN 11/01/2009    Past Surgical History:  Procedure Laterality Date  . ESOPHAGOGASTRODUODENOSCOPY    . NO PAST SURGERIES    . SHOULDER OPEN ROTATOR CUFF REPAIR Right 10/17/2017   Procedure: Open acromionectomy and right shoulder rotator cuff repair with graft;  Surgeon: Latanya Maudlin, MD;  Location: [facility];  Service: Orthopedics;  Laterality: Right;  Interscalene Block       Home Medications    Prior to Admission medications   Medication Sig Start Date End Date Taking? Authorizing Provider  HYDROcodone-acetaminophen (NORCO/VICODIN) 5-325 MG tablet Take 1 tablet by mouth every 8 (eight) hours as needed for severe pain. 03/04/20   Tasia Catchings, Giulia Hickey V, PA-C  ketorolac (TORADOL) 10 MG tablet Take 1 tablet (10 mg total) by mouth every 6 (six) hours as needed. 03/04/20   Tasia Catchings, Raeghan Demeter V, PA-C  ondansetron (ZOFRAN ODT) 4 MG disintegrating tablet Take 1 tablet (4 mg total) by mouth every 8 (eight) hours as needed for nausea or vomiting. 03/04/20   Tasia Catchings, Donyel Castagnola V, PA-C  oxymetazoline (AFRIN) 0.05 % nasal spray Place 1 spray into both nostrils 2 (two) times daily as needed for congestion.    [provider]  tamsulosin (FLOMAX) 0.4 MG CAPS capsule Take 1 capsule (0.4 mg total) by mouth daily. 03/04/20   Ok Edwards, PA-C    Family History Family History  Problem Relation Age of Onset  . Heart failure Father     Social History Social History   Tobacco Use  . Smoking status: Current Some Day Smoker    Packs/day: 0.25  . Smokeless tobacco: Never Used  Vaping Use  . Vaping Use: Never used  Substance Use Topics  . Alcohol use: Yes    Comment: occasional  . Drug use: No     Allergies   Paxil [paroxetine]   Review of Systems Review of Systems  Reason unable to perform ROS: See HPI as above.     Physical Exam Triage Vital Signs ED Triage Vitals  Enc Vitals Group     BP 03/04/20 0957 (!) 160/96     Pulse Rate 03/04/20 0957 93     Resp 03/04/20 0957 (!) 26     Temp 03/04/20 0957 98.6 F (37 C)     Temp Source 03/04/20 0957 Oral     SpO2 03/04/20 0957 99 %     Weight --      Height --      Head Circumference --      Peak Flow --      Pain Score 03/04/20 0954 8  Pain Loc --      Pain Edu? --      Excl. in [place]? --    No data found.  Updated Vital Signs BP (!) 160/96 (BP Location: Right Arm)   Pulse 93   Temp 98.6 F (37 C) (Oral)   Resp (!) 26   SpO2 99%   Physical Exam Constitutional:      Appearance: Normal appearance. He is well-developed.     Comments: Patient afebrile, nontoxic. Pacing in the room due to pain  HENT:     Head: Normocephalic and atraumatic.  Eyes:     Conjunctiva/sclera: Conjunctivae normal.     Pupils: Pupils are equal, round, and reactive to light.  Cardiovascular:     Rate and Rhythm: Normal rate and regular rhythm.  Pulmonary:     Effort: Pulmonary effort is normal. No respiratory distress.     Comments: Speaking in full sentences without difficulty. LCTAB Abdominal:     Tenderness: There is no right CVA tenderness or left CVA tenderness.  Musculoskeletal:     Cervical back: Normal range of motion and neck supple.  Skin:     General: Skin is warm and dry.  Neurological:     Mental Status: He is alert and oriented to person, place, and time.      UC Treatments / Results  Labs (all labs ordered are listed, but only abnormal results are displayed) Labs Reviewed  POCT URINALYSIS DIP (MANUAL ENTRY) - Abnormal; Notable for the following components:      Result Value   Spec Grav, UA >=1.030 (*)    Blood, UA small (*)    Protein Ur, POC trace (*)    All other components within normal limits    EKG   Radiology No results found.  Procedures Procedures (including critical care time)  Medications Ordered in UC Medications  ketorolac (TORADOL) 15 MG/ML injection 15 mg (15 mg Intramuscular Given 03/04/20 1004)    Initial Impression / Assessment and Plan / UC Course  I have reviewed the triage vital signs and the nursing notes.  Pertinent labs & imaging results that were available during my care of the patient were reviewed by me and considered in my medical decision making (see chart for details).    CT renal 2019 showed residual renal stones to bilateral kidneys, with largest in the lower pole of right kidney measuring approx 5 x 58m. Discussed history and exam consistent with urethral stone, will provide toradol injection and monitor. Given acute onset today without fever, dipstick negative for leuks/nitrites, low worries for obstructing stone causing infection at this time. If patient with improvement of symptoms, will discharge home for urethral stone management. If still with significant pain, will need to consider ED evaluation.  Patient with significant relief of symptoms after toradol injection. Flomax as directed. Toradol PO as directed, norco as needed. zofran as needed. Push fluids. Return precautions given.  Final Clinical Impressions(s) / UC Diagnoses   Final diagnoses:  Urethral stone   ED Prescriptions    Medication Sig Dispense Auth. Provider   ketorolac (TORADOL) 10 MG tablet Take 1  tablet (10 mg total) by mouth every 6 (six) hours as needed. 20 tablet Jamarion Jumonville V, PA-C   HYDROcodone-acetaminophen (NORCO/VICODIN) 5-325 MG tablet Take 1 tablet by mouth every 8 (eight) hours as needed for severe pain. 15 tablet Nevea Spiewak V, PA-C   tamsulosin (FLOMAX) 0.4 MG CAPS capsule Take 1 capsule (0.4 mg total) by mouth daily. 10  capsule Tasia Catchings, Merl Bommarito V, PA-C   ondansetron (ZOFRAN ODT) 4 MG disintegrating tablet Take 1 tablet (4 mg total) by mouth every 8 (eight) hours as needed for nausea or vomiting. 20 tablet Ok Edwards, PA-C     I have reviewed the PDMP during this encounter.   Ok Edwards, PA-C 03/04/20 1044

## 2020-03-04 NOTE — Discharge Instructions (Addendum)
Your history and exam is consistent with passing a kidney stone. Start flomax as directed. Toradol as directed, norco if needed for further pain relief. Zofran as needed for nausea/vomiting. Keep hydrated, urine should be clear to pale yellow in color. If having worsening symptoms, not able to urinate, fever, go to the emergency department for further evaluation.

## 2020-03-12 ENCOUNTER — Ambulatory Visit (INDEPENDENT_AMBULATORY_CARE_PROVIDER_SITE_OTHER): Payer: 59 | Admitting: Family Medicine

## 2020-03-12 ENCOUNTER — Encounter: Payer: Self-pay | Admitting: Family Medicine

## 2020-03-12 VITALS — BP 134/82 | HR 106 | Ht 69.0 in | Wt 229.0 lb

## 2020-03-12 DIAGNOSIS — D171 Benign lipomatous neoplasm of skin and subcutaneous tissue of trunk: Secondary | ICD-10-CM | POA: Diagnosis not present

## 2020-03-12 DIAGNOSIS — Z8249 Family history of ischemic heart disease and other diseases of the circulatory system: Secondary | ICD-10-CM | POA: Diagnosis not present

## 2020-03-12 DIAGNOSIS — Z13220 Encounter for screening for lipoid disorders: Secondary | ICD-10-CM | POA: Diagnosis not present

## 2020-03-12 DIAGNOSIS — R0683 Snoring: Secondary | ICD-10-CM | POA: Diagnosis not present

## 2020-03-12 DIAGNOSIS — G4733 Obstructive sleep apnea (adult) (pediatric): Secondary | ICD-10-CM | POA: Insufficient documentation

## 2020-03-12 NOTE — Assessment & Plan Note (Signed)
Symptoms highly suspicious for obstructive sleep apnea.  Positive family history.  We discussed options between doing a home sleep study and an in lab study he would prefer to do a home study.  Stop bang questionnaire score of 4.  Positive screen recommend full evaluation.

## 2020-03-12 NOTE — Progress Notes (Signed)
Established Patient Office Visit  Subjective:  Patient ID: Patrick Govan., male    DOB: 26-Jul-1976  Age: 44 y.o. MRN: [id number]  CC:  Chief Complaint  Patient presents with  . Establish Care  . Sleep Apnea    HPI Patrick Walls. presents for reestablish care.  He was seen here almost 4 years ago by Dr. Lynne Leader.  He does have a couple of concerns today that he would like to discuss.  His wife says that he has been snoring excessively and loudly.  He feels like it is gotten worse as he has gained a little bit of weight.  He feels like if he could lose 20 pounds it would probably improve some.  He denies waking up with morning headaches but says he can very easily fall asleep during the day.  He has never been evaluated or diagnosed with sleep apnea.  He says he does have a family history of sleep apnea several family members with it.  He also has a lump over the left clavicle he says been there for a while and its not really growing or changing.  He says that it does not bother him no pain or tenderness.  Past Medical History:  Diagnosis Date  . GERD (gastroesophageal reflux disease)   . History of hiatal hernia     Past Surgical History:  Procedure Laterality Date  . ESOPHAGOGASTRODUODENOSCOPY    . NO PAST SURGERIES    . SHOULDER OPEN ROTATOR CUFF REPAIR Right 10/17/2017   Procedure: Open acromionectomy and right shoulder rotator cuff repair with graft;  Surgeon: Latanya Maudlin, MD;  Location: [facility];  Service: Orthopedics;  Laterality: Right;  Interscalene Block    Family History  Problem Relation Age of Onset  . Heart failure Father   . Heart attack Father 46  . Heart attack Paternal Uncle     Social History   Socioeconomic History  . Marital status: Divorced    Spouse name: Not on file  . Number of children: Not on file  . Years of education: Not on file  . Highest education level: Not on file  Occupational History  . Not on file  Tobacco Use   . Smoking status: Current Some Day Smoker    Packs/day: 0.25  . Smokeless tobacco: Never Used  Vaping Use  . Vaping Use: Never used  Substance and Sexual Activity  . Alcohol use: Yes    Comment: occasional  . Drug use: No  . Sexual activity: Yes  Other Topics Concern  . Not on file  Social History Narrative  . Not on file   Social Determinants of Health   Financial Resource Strain:   . Difficulty of Paying Living Expenses:   Food Insecurity:   . Worried About Charity fundraiser in the Last Year:   . Arboriculturist in the Last Year:   Transportation Needs:   . Film/video editor (Medical):   Patrick Walls Kitchen Lack of Transportation (Non-Medical):   Physical Activity:   . Days of Exercise per Week:   . Minutes of Exercise per Session:   Stress:   . Feeling of Stress :   Social Connections:   . Frequency of Communication with Friends and Family:   . Frequency of Social Gatherings with Friends and Family:   . Attends Religious Services:   . Active Member of Clubs or Organizations:   . Attends Archivist Meetings:   Patrick Walls Kitchen Marital  Status:   Intimate Partner Violence:   . Fear of Current or Ex-Partner:   . Emotionally Abused:   Patrick Walls Kitchen Physically Abused:   . Sexually Abused:     Outpatient Medications Prior to Visit  Medication Sig Dispense Refill  . HYDROcodone-acetaminophen (NORCO/VICODIN) 5-325 MG tablet Take 1 tablet by mouth every 8 (eight) hours as needed for severe pain. 15 tablet 0  . ketorolac (TORADOL) 10 MG tablet Take 1 tablet (10 mg total) by mouth every 6 (six) hours as needed. 20 tablet 0  . ondansetron (ZOFRAN ODT) 4 MG disintegrating tablet Take 1 tablet (4 mg total) by mouth every 8 (eight) hours as needed for nausea or vomiting. 20 tablet 0  . oxymetazoline (AFRIN) 0.05 % nasal spray Place 1 spray into both nostrils 2 (two) times daily as needed for congestion.    . tamsulosin (FLOMAX) 0.4 MG CAPS capsule Take 1 capsule (0.4 mg total) by mouth daily. 10 capsule 0    No facility-administered medications prior to visit.    Allergies  Allergen Reactions  . Paxil [Paroxetine] Rash    ROS Review of Systems  Constitutional: Negative for diaphoresis, fever and unexpected weight change.  HENT: Negative for hearing loss, rhinorrhea, sneezing and tinnitus.   Eyes: Negative for visual disturbance.  Respiratory: Negative for cough and wheezing.   Cardiovascular: Negative for chest pain and palpitations.  Gastrointestinal: Negative for blood in stool, diarrhea, nausea and vomiting.  Genitourinary: Negative for discharge and dysuria.  Musculoskeletal: Negative for arthralgias and myalgias.  Skin: Negative for rash.  Neurological: Negative for headaches.  Hematological: Negative for adenopathy.  Psychiatric/Behavioral: Negative for dysphoric mood and sleep disturbance. The patient is not nervous/anxious.       Objective:    Physical Exam Constitutional:      Appearance: He is well-developed.  HENT:     Head: Normocephalic and atraumatic.     Comments: He has an approximately 4 x 5 cm smooth oval-shaped soft mass just over the medial end of the left clavicle.    Right Ear: External ear normal.     Left Ear: External ear normal.     Nose: Nose normal.     Mouth/Throat:     Mouth: Mucous membranes are moist.     Pharynx: Oropharynx is clear. No oropharyngeal exudate or posterior oropharyngeal erythema.  Cardiovascular:     Rate and Rhythm: Normal rate and regular rhythm.     Heart sounds: Normal heart sounds.  Pulmonary:     Effort: Pulmonary effort is normal.     Breath sounds: Normal breath sounds.  Skin:    General: Skin is warm and dry.  Neurological:     Mental Status: He is alert and oriented to person, place, and time.  Psychiatric:        Behavior: Behavior normal.     BP 134/82   Pulse (!) 106   Ht 5' 9"  (1.753 m)   Wt 229 lb (103.9 kg)   SpO2 99%   BMI 33.82 kg/m  Wt Readings from Last 3 Encounters:  03/12/20 229 lb  (103.9 kg)  01/25/18 225 lb (102.1 kg)  10/17/17 221 lb (100.2 kg)     Health Maintenance Due  Topic Date Due  . Hepatitis C Screening  Never done  . COVID-19 Vaccine (1) Never done  . HIV Screening  Never done    There are no preventive care reminders to display for this patient.  No results found for: TSH Lab Results  Component Value Date   WBC 11.5 (H) 01/25/2018   HGB 15.0 01/25/2018   HCT 44.7 01/25/2018   MCV 95.4 01/25/2018   PLT 293 01/25/2018   Lab Results  Component Value Date   NA 137 01/25/2018   K 3.2 (L) 01/25/2018   CO2 24 01/25/2018   GLUCOSE 138 (H) 01/25/2018   BUN 14 01/25/2018   CREATININE 1.13 01/25/2018   BILITOT 0.3 01/25/2018   ALKPHOS 55 01/25/2018   AST 52 (H) 01/25/2018   ALT 58 01/25/2018   PROT 7.7 01/25/2018   ALBUMIN 4.8 01/25/2018   CALCIUM 9.6 01/25/2018   ANIONGAP 10 01/25/2018   Lab Results  Component Value Date   CHOL 153 11/02/2009   Lab Results  Component Value Date   HDL 49.10 11/02/2009   Lab Results  Component Value Date   LDLCALC 90 11/02/2009   Lab Results  Component Value Date   TRIG 68.0 11/02/2009   Lab Results  Component Value Date   CHOLHDL 3 11/02/2009   No results found for: HGBA1C    Assessment & Plan:   Problem List Items Addressed This Visit      Other   Snoring - Primary    Symptoms highly suspicious for obstructive sleep apnea.  Positive family history.  We discussed options between doing a home sleep study and an in lab study he would prefer to do a home study.  Stop bang questionnaire score of 4.  Positive screen recommend full evaluation.      Relevant Orders   COMPLETE METABOLIC PANEL WITH GFR   Lipid panel   Home sleep test   Family history of heart disease    Strong family history of heart disease.  Dad had first heart attack at 59 and all of her his father's brothers have also had heart attacks.  He does not have high blood pressure which is very reassuring but he has never had  a lipid screening and encouraged him to do so so that we can better stratify him and discuss preventative measures for cardiovascular disease.      Relevant Orders   COMPLETE METABOLIC PANEL WITH GFR   Lipid panel    Other Visit Diagnoses    Screening, lipid       Relevant Orders   Lipid panel   Lipoma of torso          He has a soft tissue lump over the left clavicle most consistent with a lipoma.  We did discuss getting an ultrasound for further work-up just to confirm that diagnosis.  He wants to get the evaluation for the sleep apnea first and then schedule for the lipoma.  No orders of the defined types were placed in this encounter.   Follow-up: Return if symptoms worsen or fail to improve.    Beatrice Lecher, MD

## 2020-03-12 NOTE — Patient Instructions (Signed)
He is go to the lab fasting when you were able to they are open from 7 AM to 5 PM, Monday through Friday.

## 2020-03-12 NOTE — Assessment & Plan Note (Signed)
Strong family history of heart disease.  Dad had first heart attack at 48 and all of her his father's brothers have also had heart attacks.  He does not have high blood pressure which is very reassuring but he has never had a lipid screening and encouraged him to do so so that we can better stratify him and discuss preventative measures for cardiovascular disease.

## 2020-03-22 ENCOUNTER — Other Ambulatory Visit: Payer: Self-pay

## 2020-03-22 ENCOUNTER — Ambulatory Visit
Admission: EM | Admit: 2020-03-22 | Discharge: 2020-03-22 | Disposition: A | Discharge status: Home / Self Care | Payer: 59

## 2020-03-22 ENCOUNTER — Encounter: Payer: Self-pay | Admitting: Emergency Medicine

## 2020-03-22 DIAGNOSIS — R0789 Other chest pain: Secondary | ICD-10-CM | POA: Diagnosis not present

## 2020-03-22 NOTE — Discharge Instructions (Signed)
Can take pepcid for possible acid reflux. Otherwise, follow up with PCP for further evaluation.

## 2020-03-22 NOTE — ED Provider Notes (Signed)
[facility]    CSN: [id number] Arrival date & time: 03/22/20  1212      History   Chief Complaint Chief Complaint  Patient presents with  . Anxiety    HPI Patrick Walls. is a 44 y.o. male.   44 year old male comes in for chest pain that he experienced this morning.  Symptoms has since resolved.  States this morning, while rolling over in bed, started having substernal pressure.  Denies any radiation of pain, associated shortness of breath, nausea/vomiting, diaphoresis.  However, due to symptoms, felt very anxious, and drink 4-5 beers.  He denies any personal history of heart disease, exertional chest pain, exertional fatigue, dyspnea on exertion.  He does have significant paternal family history of MIs, usually onsets late 40s/early 50s.     Past Medical History:  Diagnosis Date  . GERD (gastroesophageal reflux disease)   . History of hiatal hernia     Patient Active Problem List   Diagnosis Date Noted  . Family history of heart disease 03/12/2020  . Snoring 03/12/2020  . Traumatic rotator cuff tear, right, initial encounter 10/17/2017  . Enlarged lymph node 04/20/2016  . Lumbago with sciatica, left side 01/03/2016  . HEARTBURN 11/01/2009    Past Surgical History:  Procedure Laterality Date  . ESOPHAGOGASTRODUODENOSCOPY    . NO PAST SURGERIES    . SHOULDER OPEN ROTATOR CUFF REPAIR Right 10/17/2017   Procedure: Open acromionectomy and right shoulder rotator cuff repair with graft;  Surgeon: Latanya Maudlin, MD;  Location: [facility];  Service: Orthopedics;  Laterality: Right;  Interscalene Block       Home Medications    Prior to Admission medications   Not on File    Family History Family History  Problem Relation Age of Onset  . Heart failure Father   . Heart attack Father 48  . Heart attack Paternal Uncle     Social History Social History   Tobacco Use  . Smoking status: Current Some Day Smoker    Packs/day: 0.25  . Smokeless  tobacco: Never Used  Vaping Use  . Vaping Use: Never used  Substance Use Topics  . Alcohol use: Yes    Comment: occasional  . Drug use: No     Allergies   Paxil [paroxetine]   Review of Systems Review of Systems  Reason unable to perform ROS: See HPI as above.     Physical Exam Triage Vital Signs ED Triage Vitals  Enc Vitals Group     BP 03/22/20 1217 (!) 154/103     Pulse Rate 03/22/20 1217 97     Resp 03/22/20 1217 20     Temp 03/22/20 1217 98 F (36.7 C)     Temp Source 03/22/20 1217 Oral     SpO2 03/22/20 1217 98 %     Weight --      Height --      Head Circumference --      Peak Flow --      Pain Score 03/22/20 1225 0     Pain Loc --      Pain Edu? --      Excl. in [place]? --    No data found.  Updated Vital Signs BP (!) 157/83 (BP Location: Left Arm)   Pulse 90   Temp 98 F (36.7 C) (Oral)   Resp 20   SpO2 98%   Physical Exam Constitutional:      General: He is not in acute distress.  Appearance: Normal appearance. He is not ill-appearing, toxic-appearing or diaphoretic.  HENT:     Head: Normocephalic and atraumatic.  Cardiovascular:     Rate and Rhythm: Normal rate and regular rhythm.  Pulmonary:     Effort: Pulmonary effort is normal. No respiratory distress.     Comments: LCTAB Chest:     Comments: Tender to palpation along inferior sternum.  Abdominal:     General: Bowel sounds are normal.     Palpations: Abdomen is soft.     Tenderness: There is no abdominal tenderness. There is no right CVA tenderness, left CVA tenderness, guarding or rebound.  Musculoskeletal:     Cervical back: Normal range of motion and neck supple.  Skin:    General: Skin is warm and dry.  Neurological:     Mental Status: He is alert and oriented to person, place, and time.      UC Treatments / Results  Labs (all labs ordered are listed, but only abnormal results are displayed) Labs Reviewed - No data to display  EKG   Radiology No results  found.  Procedures Procedures (including critical care time)  Medications Ordered in UC Medications - No data to display  Initial Impression / Assessment and Plan / UC Course  I have reviewed the triage vital signs and the nursing notes.  Pertinent labs & imaging results that were available during my care of the patient were reviewed by me and considered in my medical decision making (see chart for details).    Some tenderness to palpation of inferior sternum.  Patient without current chest pain, and does not have any exertional symptoms, lower suspicion for ACS.  Offered EKG, for which patient declined.  Will have patient continue to monitor symptoms, and to follow-up with PCP for further evaluation.  Discussed would benefit from baseline cardiology exam given significant family history.  Return precautions given.  Patient expresses understanding and agrees to plan.  Final Clinical Impressions(s) / UC Diagnoses   Final diagnoses:  Atypical chest pain    ED Prescriptions    None     PDMP not reviewed this encounter.   Ok Edwards, PA-C 03/22/20 1821

## 2020-03-22 NOTE — ED Notes (Signed)
Patient able to ambulate independently  

## 2020-03-22 NOTE — ED Triage Notes (Signed)
Pt presents to [facility] for assessment chest pain this morning started 10am when he rolled over in bed, lasting until approx 1 hour ago.  Patient states the chest pain made him very anxious and he drank 4-5 beer to try to offset the symptoms.  Patient crying in room with this RN, denies chest pain at this time.

## 2020-03-23 ENCOUNTER — Telehealth: Payer: Self-pay

## 2020-03-23 NOTE — Telephone Encounter (Signed)
Patient called wanting to know an update on his home sleep study order.   Can you look into this?

## 2020-03-25 NOTE — Telephone Encounter (Signed)
Just FYI to Dr Madilyn Fireman about this.   I called patient and let him know order was placed and processing and he would be contacted to set this up.

## 2020-03-25 NOTE — Telephone Encounter (Signed)
Patrick Walls   This is another one of the orders that did not enter correctly in Epic. I do have an active ticket to get this problem corrected. As of now I have 7 orders on my desk that have all been faxed to where they need to be but because the order does not properly show in epic they can not be scheduled at this time. - CF

## 2020-04-05 ENCOUNTER — Other Ambulatory Visit: Payer: Self-pay

## 2020-04-05 DIAGNOSIS — R0683 Snoring: Secondary | ICD-10-CM

## 2020-04-29 DIAGNOSIS — Z20822 Contact with and (suspected) exposure to covid-19: Secondary | ICD-10-CM | POA: Diagnosis not present

## 2020-05-14 ENCOUNTER — Encounter (HOSPITAL_BASED_OUTPATIENT_CLINIC_OR_DEPARTMENT_OTHER): Payer: 59 | Admitting: Internal Medicine

## 2020-06-16 ENCOUNTER — Ambulatory Visit (HOSPITAL_BASED_OUTPATIENT_CLINIC_OR_DEPARTMENT_OTHER): Payer: Medicaid Other | Attending: Family Medicine | Admitting: Cardiology

## 2020-06-16 ENCOUNTER — Other Ambulatory Visit: Payer: Self-pay

## 2020-06-16 VITALS — Ht 69.0 in | Wt 225.0 lb

## 2020-06-16 DIAGNOSIS — R0683 Snoring: Secondary | ICD-10-CM | POA: Diagnosis not present

## 2020-06-16 DIAGNOSIS — G4733 Obstructive sleep apnea (adult) (pediatric): Secondary | ICD-10-CM | POA: Diagnosis not present

## 2020-06-16 DIAGNOSIS — R0902 Hypoxemia: Secondary | ICD-10-CM | POA: Diagnosis not present

## 2020-06-17 ENCOUNTER — Telehealth: Payer: Self-pay | Admitting: *Deleted

## 2020-06-17 DIAGNOSIS — G4733 Obstructive sleep apnea (adult) (pediatric): Secondary | ICD-10-CM

## 2020-06-17 NOTE — Procedures (Signed)
   Patient Name: Hussam, Muniz Date: 06/16/2020 Gender: Male D.O.B: 1975-09-20 Age (years): 43 Referring Provider: Beatrice Lecher Height (inches): 24 Interpreting Physician: Fransico Him MD, ABSM Weight (lbs): 225 RPSGT: Jacolyn Reedy BMI: 33 MRN: [id number] Neck Size: 17.00  CLINICAL INFORMATION Sleep Study Type: HST  Indication for sleep study: N/A  Epworth Sleepiness Score: 1  SLEEP STUDY TECHNIQUE A multi-channel overnight portable sleep study was performed. The channels recorded were: nasal airflow, thoracic respiratory movement, and oxygen saturation with a pulse oximetry. Snoring was also monitored.  MEDICATIONS Patient self administered medications include: N/A.  SLEEP ARCHITECTURE Patient was studied for 449.9 minutes. The sleep efficiency was 100.0 % and the patient was supine for 19.2%. The arousal index was 0.0 per hour.  RESPIRATORY PARAMETERS The overall AHI was 45.2 per hour, with a central apnea index of 0.0 per hour.  The oxygen nadir was 81% during sleep.  CARDIAC DATA Mean heart rate during sleep was 64.4 bpm.  IMPRESSIONS - Severe obstructive sleep apnea occurred during this study (AHI = 45.2/h). - No significant central sleep apnea occurred during this study (CAI = 0.0/h). - Moderate oxygen desaturation was noted during this study (Min O2 = 81%). - Patient snored 21.3% during the sleep.  DIAGNOSIS - Obstructive Sleep Apnea (G47.33) - Nocturnal Hypoxemia (G47.36)  RECOMMENDATIONS  - Recommend auto CPAP from 4 to 20cm H2O with heated humidity and mask of choice.  - Positional therapy avoiding supine position during sleep. - Avoid alcohol, sedatives and other CNS depressants that may worsen sleep apnea and disrupt normal sleep architecture. - Sleep hygiene should be reviewed to assess factors that may improve sleep quality. - Weight management and regular exercise should be initiated or continued. - Return to Sleep Center in 8  weeks.  [Electronically signed] 06/17/2020 03:11 PM  Fransico Him MD, [place], American Board of Sleep Medicine   NPI: [id number]

## 2020-06-17 NOTE — Telephone Encounter (Signed)
Informed patient of sleep study results and patient understanding was verbalized. Patient understands his sleep study showed they have sleep apnea and recommend auto CPAP titration through local DME Orders have been placed in Epic. Please set 8 week OV with me and get a pulse ox overnight on CPAP.  Upon patient request DME selection is [facility] Patient understands he will be contacted by [facility] to set up his cpap. Patient understands to call if [facility] does not contact him with new setup in a timely manner. Patient understands they will be called once confirmation has been received from [facility] that they have received their new machine to schedule 10 week follow up appointment.  [facility] notified of new cpap order  Please add to airview Patient was grateful for the call and thanked me.

## 2020-06-17 NOTE — Telephone Encounter (Signed)
-----   Message from Sueanne Margarita, MD sent at 06/17/2020  3:14 PM EDT ----- Please let patient know that they have sleep apnea and recommend auto CPAP titration through local DME  Orders have been placed in Epic. Please set 8 week OV with me. Please get a PAP download in 2 weeks and get a pulse ox overnight on CPAP

## 2020-06-18 ENCOUNTER — Telehealth: Payer: Self-pay | Admitting: Family Medicine

## 2020-06-18 NOTE — Telephone Encounter (Signed)
Call pt to schedule ap appt to go over sleep study results. Can be virtual or in person

## 2020-06-22 NOTE — Telephone Encounter (Signed)
Appointment has been made. No further questions at this time.

## 2020-06-30 ENCOUNTER — Ambulatory Visit (INDEPENDENT_AMBULATORY_CARE_PROVIDER_SITE_OTHER): Payer: Medicaid Other | Admitting: Family Medicine

## 2020-06-30 ENCOUNTER — Encounter: Payer: Self-pay | Admitting: Family Medicine

## 2020-06-30 VITALS — BP 127/94 | HR 72 | Ht 69.0 in | Wt 230.0 lb

## 2020-06-30 DIAGNOSIS — R0683 Snoring: Secondary | ICD-10-CM

## 2020-06-30 DIAGNOSIS — Z8249 Family history of ischemic heart disease and other diseases of the circulatory system: Secondary | ICD-10-CM

## 2020-06-30 DIAGNOSIS — G4733 Obstructive sleep apnea (adult) (pediatric): Secondary | ICD-10-CM

## 2020-06-30 DIAGNOSIS — F419 Anxiety disorder, unspecified: Secondary | ICD-10-CM | POA: Insufficient documentation

## 2020-06-30 DIAGNOSIS — Z13220 Encounter for screening for lipoid disorders: Secondary | ICD-10-CM | POA: Diagnosis not present

## 2020-06-30 DIAGNOSIS — I10 Essential (primary) hypertension: Secondary | ICD-10-CM | POA: Insufficient documentation

## 2020-06-30 MED ORDER — LISINOPRIL 10 MG PO TABS: 10.0000 mg | ORAL_TABLET | Freq: Every day | ORAL | 3 refills | Status: DC

## 2020-06-30 NOTE — Progress Notes (Addendum)
Established Patient Office Visit  Subjective:  Patient ID: Patrick Pitcock., male    DOB: [birth year]  Age: 44 y.o. MRN: [id number]  CC:  Chief Complaint  Patient presents with  . Results    HPI Patrick Walls. presents for sleep study results.  He is here to go over those in detail.  He had a home sleep study which showed an AHI of 45.  He says that they tried to get DME supplies for CPAP through his insurer but they declined it saying that he actually needed an order for a full sleep study in the lab.  Thus he is requesting a full lab sleep study so that he can get the sleep supplies.  He reports that he often wakes up feeling fatigued he does feel excessively tired during the day.  He snores loudly on his back and side and now even on his stomach when he sleeps on his stomach he has gained some weight over the last few years.  His wife is getting to the point where the snoring is really disruptive for her sleep quality.   He also wanted to discuss his anxiety he just feels like he has been on a little bit more anxious and worried over the last several years but feels like it is gotten a little bit worse.  He is very worried about something happening to his daughter especially since his ex-wife committed suicide.  The thoughts are almost daily.  He just feels like he cannot quite let it go.  Past Medical History:  Diagnosis Date  . GERD (gastroesophageal reflux disease)   . History of hiatal hernia     Past Surgical History:  Procedure Laterality Date  . ESOPHAGOGASTRODUODENOSCOPY    . NO PAST SURGERIES    . SHOULDER OPEN ROTATOR CUFF REPAIR Right 10/17/2017   Procedure: Open acromionectomy and right shoulder rotator cuff repair with graft;  Surgeon: Latanya Maudlin, MD;  Location: [facility];  Service: Orthopedics;  Laterality: Right;  Interscalene Block    Family History  Problem Relation Age of Onset  . Heart failure Father   . Heart attack Father 48  . Heart  attack Paternal Uncle     Social History   Socioeconomic History  . Marital status: Divorced    Spouse name: Not on file  . Number of children: Not on file  . Years of education: Not on file  . Highest education level: Not on file  Occupational History  . Not on file  Tobacco Use  . Smoking status: Current Some Day Smoker    Packs/day: 0.25  . Smokeless tobacco: Never Used  Vaping Use  . Vaping Use: Never used  Substance and Sexual Activity  . Alcohol use: Yes    Comment: occasional  . Drug use: No  . Sexual activity: Yes  Other Topics Concern  . Not on file  Social History Narrative  . Not on file   Social Determinants of Health   Financial Resource Strain:   . Difficulty of Paying Living Expenses: Not on file  Food Insecurity:   . Worried About Charity fundraiser in the Last Year: Not on file  . Ran Out of Food in the Last Year: Not on file  Transportation Needs:   . Lack of Transportation (Medical): Not on file  . Lack of Transportation (Non-Medical): Not on file  Physical Activity:   . Days of Exercise per Week: Not on file  .  Minutes of Exercise per Session: Not on file  Stress:   . Feeling of Stress : Not on file  Social Connections:   . Frequency of Communication with Friends and Family: Not on file  . Frequency of Social Gatherings with Friends and Family: Not on file  . Attends Religious Services: Not on file  . Active Member of Clubs or Organizations: Not on file  . Attends Archivist Meetings: Not on file  . Marital Status: Not on file  Intimate Partner Violence:   . Fear of Current or Ex-Partner: Not on file  . Emotionally Abused: Not on file  . Physically Abused: Not on file  . Sexually Abused: Not on file    No outpatient medications prior to visit.   No facility-administered medications prior to visit.    Allergies  Allergen Reactions  . Paxil [Paroxetine] Rash    ROS Review of Systems    Objective:    Physical  Exam Vitals reviewed.  Constitutional:      Appearance: He is well-developed.  HENT:     Head: Normocephalic and atraumatic.  Eyes:     Conjunctiva/sclera: Conjunctivae normal.  Cardiovascular:     Rate and Rhythm: Normal rate.  Pulmonary:     Effort: Pulmonary effort is normal.  Skin:    General: Skin is dry.     Coloration: Skin is not pale.  Neurological:     Mental Status: He is alert and oriented to person, place, and time.  Psychiatric:        Behavior: Behavior normal.     BP (!) 127/94   Pulse 72   Ht 5' 9"  (1.753 m)   Wt 230 lb (104.3 kg)   SpO2 100%   BMI 33.97 kg/m  Wt Readings from Last 3 Encounters:  06/30/20 230 lb (104.3 kg)  06/16/20 225 lb (102.1 kg)  03/12/20 229 lb (103.9 kg)     Health Maintenance Due  Topic Date Due  . Hepatitis C Screening  Never done  . COVID-19 Vaccine (1) Never done  . HIV Screening  Never done    There are no preventive care reminders to display for this patient.  No results found for: TSH Lab Results  Component Value Date   WBC 11.5 (H) 01/25/2018   HGB 15.0 01/25/2018   HCT 44.7 01/25/2018   MCV 95.4 01/25/2018   PLT 293 01/25/2018   Lab Results  Component Value Date   NA 137 01/25/2018   K 3.2 (L) 01/25/2018   CO2 24 01/25/2018   GLUCOSE 138 (H) 01/25/2018   BUN 14 01/25/2018   CREATININE 1.13 01/25/2018   BILITOT 0.3 01/25/2018   ALKPHOS 55 01/25/2018   AST 52 (H) 01/25/2018   ALT 58 01/25/2018   PROT 7.7 01/25/2018   ALBUMIN 4.8 01/25/2018   CALCIUM 9.6 01/25/2018   ANIONGAP 10 01/25/2018   Lab Results  Component Value Date   CHOL 153 11/02/2009   Lab Results  Component Value Date   HDL 49.10 11/02/2009   Lab Results  Component Value Date   LDLCALC 90 11/02/2009   Lab Results  Component Value Date   TRIG 68.0 11/02/2009   Lab Results  Component Value Date   CHOLHDL 3 11/02/2009   No results found for: HGBA1C    Assessment & Plan:   Problem List Items Addressed This Visit       Cardiovascular and Mediastinum   Essential hypertension    New diagnosis.  In  looking back his blood pressures have been elevated on many occasions that he does have a strong family history.  I am going to start him on lisinopril for blood pressure.  Return in 1 to 2 months to recheck blood pressure.      Relevant Medications   lisinopril (ZESTRIL) 10 MG tablet     Respiratory   OSA (obstructive sleep apnea) - Primary    Home sleep assessment on June 16, 2020 shows AHI of 45.2, no significant central apneas with moderate oxygen desaturation to 81%.  Reviewed results with him today and importance of treating the sleep apnea he is very concerned and really wants to get the in lab study done so that he can get the supplies covered.  Will place order today it looks like some of the supplies were ordered through [facility] so we may need to reach out to them to confirm exactly what he needs.  He would like to get it done ASAP and we will work on that      Relevant Orders   Split night study     Other   Family history of heart disease   Relevant Orders   COMPLETE METABOLIC PANEL WITH GFR   Lipid panel   Anxiety    Discussed options I think he would really benefit from working with a therapist or counselor I think this could help him a great deal and he is open to it.  Also discussed considering a trial of medication in the future but he really wants to try to get his sleep apnea treated first to see if this helps with some of his symptoms.  This is perfectly reasonable.       Other Visit Diagnoses    Screening, lipid       Relevant Orders   COMPLETE METABOLIC PANEL WITH GFR   Lipid panel      Meds ordered this encounter  Medications  . lisinopril (ZESTRIL) 10 MG tablet    Sig: Take 1 tablet (10 mg total) by mouth daily.    Dispense:  30 tablet    Refill:  3    Follow-up: Return in about 2 months (around 08/30/2020).    Beatrice Lecher, MD

## 2020-06-30 NOTE — Assessment & Plan Note (Signed)
Discussed options I think he would really benefit from working with a therapist or counselor I think this could help him a great deal and he is open to it.  Also discussed considering a trial of medication in the future but he really wants to try to get his sleep apnea treated first to see if this helps with some of his symptoms.  This is perfectly reasonable.

## 2020-06-30 NOTE — Addendum Note (Signed)
Addended by: Beatrice Lecher D on: 06/30/2020 03:12 PM   Modules accepted: Orders, Level of Service

## 2020-06-30 NOTE — Assessment & Plan Note (Addendum)
Home sleep assessment on June 16, 2020 shows AHI of 45.2, no significant central apneas with moderate oxygen desaturation to 81%.  Reviewed results with him today and importance of treating the sleep apnea he is very concerned and really wants to get the in lab study done so that he can get the supplies covered.  Will place order today it looks like some of the supplies were ordered through [facility] so we may need to reach out to them to confirm exactly what he needs.  He would like to get it done ASAP and we will work on that

## 2020-06-30 NOTE — Assessment & Plan Note (Signed)
New diagnosis.  In looking back his blood pressures have been elevated on many occasions that he does have a strong family history.  I am going to start him on lisinopril for blood pressure.  Return in 1 to 2 months to recheck blood pressure.

## 2020-07-05 DIAGNOSIS — R0683 Snoring: Secondary | ICD-10-CM | POA: Diagnosis not present

## 2020-07-05 DIAGNOSIS — Z13220 Encounter for screening for lipoid disorders: Secondary | ICD-10-CM | POA: Diagnosis not present

## 2020-07-05 DIAGNOSIS — Z8249 Family history of ischemic heart disease and other diseases of the circulatory system: Secondary | ICD-10-CM | POA: Diagnosis not present

## 2020-07-05 DIAGNOSIS — Z23 Encounter for immunization: Secondary | ICD-10-CM | POA: Diagnosis not present

## 2020-07-05 DIAGNOSIS — G4710 Hypersomnia, unspecified: Secondary | ICD-10-CM | POA: Diagnosis not present

## 2020-07-06 LAB — COMPLETE METABOLIC PANEL WITH GFR
AG Ratio: 2.1 (calc) (ref 1.0–2.5)
ALT: 40 U/L (ref 9–46)
AST: 25 U/L (ref 10–40)
Albumin: 4.5 g/dL (ref 3.6–5.1)
Alkaline phosphatase (APISO): 62 U/L (ref 36–130)
BUN: 16 mg/dL (ref 7–25)
CO2: 29 mmol/L (ref 20–32)
Calcium: 9.8 mg/dL (ref 8.6–10.3)
Chloride: 104 mmol/L (ref 98–110)
Creat: 0.91 mg/dL (ref 0.60–1.35)
GFR, Est African American: 118 mL/min/{1.73_m2} (ref >=60)
GFR, Est Non African American: 101 mL/min/{1.73_m2} (ref >=60)
Globulin: 2.1 g/dL (calc) (ref 1.9–3.7)
Glucose, Bld: 93 mg/dL (ref 65–99)
Potassium: 4.3 mmol/L (ref 3.5–5.3)
Sodium: 139 mmol/L (ref 135–146)
Total Bilirubin: 0.8 mg/dL (ref 0.2–1.2)
Total Protein: 6.6 g/dL (ref 6.1–8.1)

## 2020-07-06 LAB — LIPID PANEL
Cholesterol: 161 mg/dL (ref <200)
HDL: 50 mg/dL (ref >=40)
LDL Cholesterol (Calc): 91 mg/dL (calc)
Non-HDL Cholesterol (Calc): 111 mg/dL (calc) (ref <130)
Total CHOL/HDL Ratio: 3.2 (calc) (ref <5.0)
Triglycerides: 104 mg/dL (ref <150)

## 2020-07-06 NOTE — Progress Notes (Signed)
All labs are normal.

## 2020-08-04 ENCOUNTER — Telehealth: Payer: Self-pay | Admitting: Family Medicine

## 2020-08-04 NOTE — Telephone Encounter (Signed)
Patrick Walls called today in regards to a sleep study. He stated it wa denied by insurance and is asking if someone from our office could give him a call back.

## 2020-08-06 NOTE — Telephone Encounter (Signed)
Pt was approved for sleep study per [facility].

## 2020-08-29 ENCOUNTER — Ambulatory Visit: Payer: Medicaid Other | Attending: Family Medicine | Admitting: Pulmonary Disease

## 2020-08-29 ENCOUNTER — Other Ambulatory Visit: Payer: Self-pay

## 2020-08-29 DIAGNOSIS — G4733 Obstructive sleep apnea (adult) (pediatric): Secondary | ICD-10-CM | POA: Diagnosis not present

## 2020-08-30 ENCOUNTER — Ambulatory Visit: Payer: Medicaid Other | Admitting: Family Medicine

## 2020-09-02 DIAGNOSIS — Z20822 Contact with and (suspected) exposure to covid-19: Secondary | ICD-10-CM | POA: Diagnosis not present

## 2020-09-07 ENCOUNTER — Telehealth: Payer: Self-pay | Admitting: Family Medicine

## 2020-09-07 NOTE — Telephone Encounter (Signed)
Patient called and left a Voicemail asking if someone can give him his sleep apnea results from his sleep study on 12/12/2. Patient requested a call back. Thank you

## 2020-09-09 NOTE — Telephone Encounter (Signed)
Pt was advised that the sleep study hasn't been reviewed.   Recommended that he call [facility] where this was conducted to see if he could get the information he requested. Pt stated that he would call.

## 2020-09-21 ENCOUNTER — Encounter: Payer: Self-pay | Admitting: Family Medicine

## 2020-09-21 ENCOUNTER — Other Ambulatory Visit: Payer: Self-pay

## 2020-09-21 ENCOUNTER — Ambulatory Visit (INDEPENDENT_AMBULATORY_CARE_PROVIDER_SITE_OTHER): Payer: Medicaid Other | Admitting: Family Medicine

## 2020-09-21 VITALS — BP 125/75 | HR 91 | Ht 69.0 in | Wt 229.0 lb

## 2020-09-21 DIAGNOSIS — G4733 Obstructive sleep apnea (adult) (pediatric): Secondary | ICD-10-CM | POA: Diagnosis not present

## 2020-09-21 DIAGNOSIS — I10 Essential (primary) hypertension: Secondary | ICD-10-CM

## 2020-09-21 DIAGNOSIS — Z1211 Encounter for screening for malignant neoplasm of colon: Secondary | ICD-10-CM | POA: Diagnosis not present

## 2020-09-21 LAB — BASIC METABOLIC PANEL WITH GFR
BUN: 18 mg/dL (ref 7–25)
CO2: 29 mmol/L (ref 20–32)
Calcium: 9.8 mg/dL (ref 8.6–10.3)
Chloride: 106 mmol/L (ref 98–110)
Creat: 0.82 mg/dL (ref 0.60–1.35)
GFR, Est African American: 123 mL/min/{1.73_m2} (ref >=60)
GFR, Est Non African American: 106 mL/min/{1.73_m2} (ref >=60)
Glucose, Bld: 82 mg/dL (ref 65–99)
Potassium: 4.4 mmol/L (ref 3.5–5.3)
Sodium: 140 mmol/L (ref 135–146)

## 2020-09-21 MED ORDER — LISINOPRIL 10 MG PO TABS: 10.0000 mg | ORAL_TABLET | Freq: Every day | ORAL | 1 refills | Status: DC

## 2020-09-21 NOTE — Progress Notes (Signed)
Established Patient Office Visit  Subjective:  Patient ID: Patrick Walls., male    DOB: 02-23-1976  Age: 45 y.o. MRN: [id number]  CC:  Chief Complaint  Patient presents with  . Sleep Apnea    HPI Patrick Walls. presents for   Hypertension- Pt denies chest pain, SOB, dizziness, or heart palpitations.  Taking meds as directed w/o problems.  Denies medication side effects.  Really happy with his new blood pressure regimen and has been taking it consistently.  F/U OSA -he did have his sleep study performed [facility] approximately 3 weeks ago unfortunately we still do not have the results back for that.  He feels like his snoring is actually getting a little worse.  Past Medical History:  Diagnosis Date  . GERD (gastroesophageal reflux disease)   . History of hiatal hernia     Past Surgical History:  Procedure Laterality Date  . ESOPHAGOGASTRODUODENOSCOPY    . NO PAST SURGERIES    . SHOULDER OPEN ROTATOR CUFF REPAIR Right 10/17/2017   Procedure: Open acromionectomy and right shoulder rotator cuff repair with graft;  Surgeon: Latanya Maudlin, MD;  Location: [facility];  Service: Orthopedics;  Laterality: Right;  Interscalene Block    Family History  Problem Relation Age of Onset  . Heart failure Father   . Heart attack Father 29  . Heart attack Paternal Uncle     Social History   Socioeconomic History  . Marital status: Divorced    Spouse name: Not on file  . Number of children: Not on file  . Years of education: Not on file  . Highest education level: Not on file  Occupational History  . Not on file  Tobacco Use  . Smoking status: Current Some Day Smoker    Packs/day: 0.25  . Smokeless tobacco: Never Used  Vaping Use  . Vaping Use: Never used  Substance and Sexual Activity  . Alcohol use: Yes    Comment: occasional  . Drug use: No  . Sexual activity: Yes  Other Topics Concern  . Not on file  Social History Narrative  . Not on file   Social  Determinants of Health   Financial Resource Strain: Not on file  Food Insecurity: Not on file  Transportation Needs: Not on file  Physical Activity: Not on file  Stress: Not on file  Social Connections: Not on file  Intimate Partner Violence: Not on file    Outpatient Medications Prior to Visit  Medication Sig Dispense Refill  . lisinopril (ZESTRIL) 10 MG tablet Take 1 tablet (10 mg total) by mouth daily. 30 tablet 3   No facility-administered medications prior to visit.    Allergies  Allergen Reactions  . Paxil [Paroxetine] Rash    ROS Review of Systems    Objective:    Physical Exam Constitutional:      Appearance: He is well-developed.  HENT:     Head: Normocephalic and atraumatic.     Mouth/Throat:     Pharynx: No posterior oropharyngeal erythema.  Neck:     Thyroid: No thyromegaly.  Cardiovascular:     Rate and Rhythm: Normal rate.     Heart sounds: Normal heart sounds.  Pulmonary:     Effort: Pulmonary effort is normal.     Breath sounds: Normal breath sounds.  Musculoskeletal:     Cervical back: Neck supple.  Lymphadenopathy:     Cervical: No cervical adenopathy.  Skin:    General: Skin is warm and  dry.  Neurological:     Mental Status: He is alert and oriented to person, place, and time.  Psychiatric:        Mood and Affect: Mood normal.     BP 125/75   Pulse 91   Ht 5' 9"  (1.753 m)   Wt 229 lb (103.9 kg)   SpO2 100%   BMI 33.82 kg/m  Wt Readings from Last 3 Encounters:  09/21/20 229 lb (103.9 kg)  06/30/20 230 lb (104.3 kg)  06/16/20 225 lb (102.1 kg)     Health Maintenance Due  Topic Date Due  . Hepatitis C Screening  Never done  . HIV Screening  Never done  . COLONOSCOPY (Pts 45-31yr Insurance coverage will need to be confirmed)  Never done    There are no preventive care reminders to display for this patient.  No results found for: TSH Lab Results  Component Value Date   WBC 11.5 (H) 01/25/2018   HGB 15.0 01/25/2018    HCT 44.7 01/25/2018   MCV 95.4 01/25/2018   PLT 293 01/25/2018   Lab Results  Component Value Date   NA 139 07/05/2020   K 4.3 07/05/2020   CO2 29 07/05/2020   GLUCOSE 93 07/05/2020   BUN 16 07/05/2020   CREATININE 0.91 07/05/2020   BILITOT 0.8 07/05/2020   ALKPHOS 55 01/25/2018   AST 25 07/05/2020   ALT 40 07/05/2020   PROT 6.6 07/05/2020   ALBUMIN 4.8 01/25/2018   CALCIUM 9.8 07/05/2020   ANIONGAP 10 01/25/2018   Lab Results  Component Value Date   CHOL 161 07/05/2020   Lab Results  Component Value Date   HDL 50 07/05/2020   Lab Results  Component Value Date   LDLCALC 91 07/05/2020   Lab Results  Component Value Date   TRIG 104 07/05/2020   Lab Results  Component Value Date   CHOLHDL 3.2 07/05/2020   No results found for: HGBA1C    Assessment & Plan:   Problem List Items Addressed This Visit      Cardiovascular and Mediastinum   Essential hypertension    Well controlled. Continue current regimen.  Due to recheck BMP.  Follow up in  6 mo      Relevant Medications   lisinopril (ZESTRIL) 10 MG tablet   Other Relevant Orders   BASIC METABOLIC PANEL WITH GFR     Respiratory   OSA (obstructive sleep apnea) - Primary    Had a home sleep study back in September but his insurance is requiring an in lab study to cover supplies.  He did have a sleep study performed about 3 weeks ago and we were supposed to have the results back today to go over it.  We have called the sleep lab and asked if they could expedite the reading/interpretation of his study since again it was done 3 weeks ago.  We will call him as soon as we get those results back and we can get supplies ordered through one of the home health companies.       Other Visit Diagnoses    Screening for malignant neoplasm of colon       Relevant Orders   Cologuard      [place] colon cancer screening guidelines and options for screening.  He would like to do a Cologuard.  Meds ordered this encounter   Medications  . lisinopril (ZESTRIL) 10 MG tablet    Sig: Take 1 tablet (10 mg total) by mouth daily.  Dispense:  90 tablet    Refill:  1    Follow-up: Return in about 2 months (around 11/19/2020) for sleep apnea/CPAP follow -up. Beatrice Lecher, MD

## 2020-09-21 NOTE — Assessment & Plan Note (Addendum)
Well controlled. Continue current regimen.  Due to recheck BMP.  Follow up in  6 mo

## 2020-09-21 NOTE — Assessment & Plan Note (Signed)
Had a home sleep study back in September but his insurance is requiring an in lab study to cover supplies.  He did have a sleep study performed about 3 weeks ago and we were supposed to have the results back today to go over it.  We have called the sleep lab and asked if they could expedite the reading/interpretation of his study since again it was done 3 weeks ago.  We will call him as soon as we get those results back and we can get supplies ordered through one of the home health companies.

## 2020-09-22 MED ORDER — AMBULATORY NON FORMULARY MEDICATION: 0 refills | Status: AC

## 2020-09-22 NOTE — Addendum Note (Signed)
Addended by: Beatrice Lecher D on: 09/22/2020 05:20 PM   Modules accepted: Orders

## 2020-09-22 NOTE — Procedures (Signed)
Patient Name: Patrick Walls, Patrick Walls Date: 08/29/2020 Gender: Male D.O.B: 10/13/1975 Age (years): 44 Referring Provider: Beatrice Lecher Height (inches): 69 Interpreting Physician: Baird Lyons MD, ABSM Weight (lbs): 225 RPSGT: Rosebud Poles BMI: 33 MRN: [id number] Neck Size: 17.00 <br> <br> CLINICAL INFORMATION Sleep Study Type: NPSG    Indication for sleep study: N/A    Epworth Sleepiness Score: 1    SLEEP STUDY TECHNIQUE As per the AASM Manual for the Scoring of Sleep and Associated Events v2.3 (April 2016) with a hypopnea requiring 4% desaturations.  The channels recorded and monitored were frontal, central and occipital EEG, electrooculogram (EOG), submentalis EMG (chin), nasal and oral airflow, thoracic and abdominal wall motion, anterior tibialis EMG, snore microphone, electrocardiogram, and pulse oximetry.  MEDICATIONS Medications self-administered by patient taken the night of the study : N/A  SLEEP ARCHITECTURE The study was initiated at 10:13:28 PM and ended at 4:48:16 AM.  Sleep onset time was 40.3 minutes and the sleep efficiency was 74.8%. The total sleep time was 295.5 minutes.  Stage REM latency was 111.0 minutes.  The patient spent 3.21% of the night in stage N1 sleep, 34.52% in stage N2 sleep, 46.53% in stage N3 and 15.7% in REM.  Alpha intrusion was absent.  Supine sleep was 0.00%.  RESPIRATORY PARAMETERS The overall apnea/hypopnea index (AHI) was 10.2 per hour. There were 21 total apneas, including 21 obstructive, 0 central and 0 mixed apneas. There were 29 hypopneas and 0 RERAs.  The AHI during Stage REM sleep was 0.0 per hour.  AHI while supine was N/A per hour.  The mean oxygen saturation was 94.42%. The minimum SpO2 during sleep was 87.00%.  loud snoring was noted during this study.  CARDIAC DATA The 2 lead EKG demonstrated sinus rhythm. The mean heart rate was 58.91 beats per minute. Other EKG findings include: None.   LEG  MOVEMENT DATA The total PLMS were 0 with a resulting PLMS index of 0.00. Associated arousal with leg movement index was 0.0 .  IMPRESSIONS - Mild obstructive sleep apnea occurred during this study (AHI = 10.2/h). Events were predominantly noted in the right lateral position - No significant central sleep apnea occurred during this study (CAI = 0.0/h). - Mild oxygen desaturation was noted during this study (Min O2 = 87.00%). - The patient snored with loud snoring volume. - No cardiac abnormalities were noted during this study. - Clinically significant periodic limb movements did not occur during sleep. No significant associated arousals.   DIAGNOSIS - Obstructive Sleep Apnea (G47.33)   RECOMMENDATIONS - Options include positional therapy & weight loss alone. If very symptomatic, can consider dental appliance or CPAP therapy - Avoid alcohol, sedatives and other CNS depressants that may worsen sleep apnea and disrupt normal sleep architecture. - Sleep hygiene should be reviewed to assess factors that may improve sleep quality. - Weight management and regular exercise should be initiated or continued if appropriate.  Kara Mead MD Board Certified in [place]

## 2020-09-22 NOTE — Progress Notes (Signed)
All labs are normal.

## 2020-10-01 DIAGNOSIS — Z1211 Encounter for screening for malignant neoplasm of colon: Secondary | ICD-10-CM | POA: Diagnosis not present

## 2020-10-01 LAB — COLOGUARD: Cologuard: NEGATIVE

## 2020-10-13 LAB — EXTERNAL GENERIC LAB PROCEDURE: COLOGUARD: NEGATIVE

## 2020-10-13 LAB — COLOGUARD: COLOGUARD: NEGATIVE

## 2020-12-07 DIAGNOSIS — G4733 Obstructive sleep apnea (adult) (pediatric): Secondary | ICD-10-CM | POA: Diagnosis not present

## 2020-12-19 ENCOUNTER — Encounter: Payer: Self-pay | Admitting: Emergency Medicine

## 2020-12-19 ENCOUNTER — Ambulatory Visit (INDEPENDENT_AMBULATORY_CARE_PROVIDER_SITE_OTHER): Payer: Medicaid Other

## 2020-12-19 ENCOUNTER — Ambulatory Visit
Admission: EM | Admit: 2020-12-19 | Discharge: 2020-12-19 | Disposition: A | Discharge status: Home / Self Care | Payer: Medicaid Other | Attending: Urgent Care | Admitting: Urgent Care

## 2020-12-19 ENCOUNTER — Other Ambulatory Visit: Payer: Self-pay

## 2020-12-19 DIAGNOSIS — M79671 Pain in right foot: Secondary | ICD-10-CM

## 2020-12-19 DIAGNOSIS — S9031XA Contusion of right foot, initial encounter: Secondary | ICD-10-CM | POA: Diagnosis not present

## 2020-12-19 DIAGNOSIS — W19XXXA Unspecified fall, initial encounter: Secondary | ICD-10-CM

## 2020-12-19 DIAGNOSIS — M7989 Other specified soft tissue disorders: Secondary | ICD-10-CM | POA: Diagnosis not present

## 2020-12-19 MED ORDER — NAPROXEN 500 MG PO TABS: 500.0000 mg | ORAL_TABLET | Freq: Two times a day (BID) | ORAL | 0 refills | Status: DC

## 2020-12-19 NOTE — ED Provider Notes (Signed)
[place]   MRN: [id number] DOB: [birth year]  Subjective:   Patrick Walls. is a 45 y.o. male presenting for 2-week history of persistent right foot pain.  Patient states that he works in [employer] and actually fell through a week floor, caught his leg up to the knee but bent his foot in a really awkward way.  He has not taken any medications.  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  Wants to make sure he does not have a fracture as he continues to have pain.  No current facility-administered medications for this encounter.  Current Outpatient Medications:  .  AMBULATORY NON FORMULARY MEDICATION, Medication Name: CPAP with humidifier and supplies.  Diagnosis obstructive sleep apnea.  AHI of 10.  Please see attached copy of sleep study., Disp: 1 Units, Rfl: 0 .  lisinopril (ZESTRIL) 10 MG tablet, Take 1 tablet (10 mg total) by mouth daily., Disp: 90 tablet, Rfl: 1   Allergies  Allergen Reactions  . Paxil [Paroxetine] Rash    Past Medical History:  Diagnosis Date  . GERD (gastroesophageal reflux disease)   . History of hiatal hernia      Past Surgical History:  Procedure Laterality Date  . ESOPHAGOGASTRODUODENOSCOPY    . NO PAST SURGERIES    . SHOULDER OPEN ROTATOR CUFF REPAIR Right 10/17/2017   Procedure: Open acromionectomy and right shoulder rotator cuff repair with graft;  Surgeon: Latanya Maudlin, MD;  Location: [facility];  Service: Orthopedics;  Laterality: Right;  Interscalene Block    Family History  Problem Relation Age of Onset  . Heart failure Father   . Heart attack Father 19  . Heart attack Paternal Uncle     Social History   Tobacco Use  . Smoking status: Current Some Day Smoker    Packs/day: 0.25  . Smokeless tobacco: Never Used  Vaping Use  . Vaping Use: Never used  Substance Use Topics  . Alcohol use: Yes    Comment: occasional  . Drug use: No    ROS   Objective:   Vitals: BP 132/84 (BP Location: Right Arm)    Pulse 90   Temp 98.5 F (36.9 C) (Oral)   Resp 18   SpO2 98%   Physical Exam Constitutional:      General: He is not in acute distress.    Appearance: Normal appearance. He is well-developed and normal weight. He is not ill-appearing, toxic-appearing or diaphoretic.  HENT:     Head: Normocephalic and atraumatic.     Right Ear: External ear normal.     Left Ear: External ear normal.     Nose: Nose normal.     Mouth/Throat:     Pharynx: Oropharynx is clear.  Eyes:     General: No scleral icterus.       Right eye: No discharge.        Left eye: No discharge.     Extraocular Movements: Extraocular movements intact.     Pupils: Pupils are equal, round, and reactive to light.  Cardiovascular:     Rate and Rhythm: Normal rate.  Pulmonary:     Effort: Pulmonary effort is normal.  Musculoskeletal:     Cervical back: Normal range of motion.     Right foot: Normal range of motion and normal capillary refill. Tenderness (Over area outlined) present. No swelling, deformity, laceration, bony tenderness or crepitus.       Feet:  Neurological:     Mental Status: He is alert  and oriented to person, place, and time.  Psychiatric:        Mood and Affect: Mood normal.        Behavior: Behavior normal.        Thought Content: Thought content normal.        Judgment: Judgment normal.     DG Foot Complete Right  Result Date: 12/19/2020 CLINICAL DATA:  Right foot pain after falling through a floor 2 weeks ago. Pain in the proximal 3rd through 5th metatarsals. EXAM: RIGHT FOOT COMPLETE - 3+ VIEW COMPARISON:  None. FINDINGS: The mineralization and alignment are normal. There is no evidence of acute fracture or dislocation. There is no evidence of fracture or subluxation at the Lisfranc joint. The joint spaces are preserved. There appears to be some lateral soft tissue swelling in the forefoot. A small plantar calcaneal spur is noted. IMPRESSION: No evidence of acute fracture or dislocation.  Suggested lateral forefoot soft tissue swelling. Electronically Signed   By: Richardean Sale M.D.   On: 12/19/2020 16:01   Assessment and Plan :   PDMP not reviewed this encounter.  1. Contusion of right foot, initial encounter   2. Foot pain, right     We will manage conservatively for foot contusion with naproxen and rest. Counseled patient on potential for adverse effects with medications prescribed/recommended today, ER and return-to-clinic precautions discussed, patient verbalized understanding.    Jaynee Eagles, [place] 12/19/20 [phone]

## 2020-12-19 NOTE — ED Triage Notes (Signed)
Pt here for right foot pain after falling through floor 2 weeks ago; pt sts still having pain

## 2021-01-07 DIAGNOSIS — G4733 Obstructive sleep apnea (adult) (pediatric): Secondary | ICD-10-CM | POA: Diagnosis not present

## 2021-01-21 ENCOUNTER — Telehealth: Payer: Self-pay | Admitting: Family Medicine

## 2021-01-21 DIAGNOSIS — I10 Essential (primary) hypertension: Secondary | ICD-10-CM

## 2021-01-21 NOTE — Telephone Encounter (Signed)
lisinopril (ZESTRIL) 10 MG tablet [[id number]] Patient is requesting a 90 day refill of this medicine before his insurance runs out on May 12th . Can you call this in for him? Please advise pt

## 2021-01-21 NOTE — Telephone Encounter (Signed)
Called pt back and advised him that he does have a refill on his medication. He did check this and realized that he was looking at an old prescription and did not need a refill.

## 2021-01-31 ENCOUNTER — Ambulatory Visit: Payer: Medicaid Other | Admitting: Cardiology

## 2021-02-06 DIAGNOSIS — G4733 Obstructive sleep apnea (adult) (pediatric): Secondary | ICD-10-CM | POA: Diagnosis not present

## 2021-02-10 ENCOUNTER — Telehealth: Payer: Self-pay | Admitting: *Deleted

## 2021-02-10 NOTE — Telephone Encounter (Signed)
The patient has been notified of the result and verbalized understanding.  All questions (if any) were answered. Pt is aware and agreeable to improving in compliance.

## 2021-02-10 NOTE — Telephone Encounter (Signed)
-----   Message from Sueanne Margarita, MD sent at 02/09/2021  8:55 AM EDT ----- Good AHI on PAP but needs to improve compliance

## 2021-03-09 DIAGNOSIS — G4733 Obstructive sleep apnea (adult) (pediatric): Secondary | ICD-10-CM | POA: Diagnosis not present

## 2021-03-22 ENCOUNTER — Ambulatory Visit
Admission: EM | Admit: 2021-03-22 | Discharge: 2021-03-22 | Disposition: A | Discharge status: Home / Self Care | Payer: Medicaid Other | Attending: Emergency Medicine | Admitting: Emergency Medicine

## 2021-03-22 ENCOUNTER — Encounter: Payer: Self-pay | Admitting: Emergency Medicine

## 2021-03-22 DIAGNOSIS — L03116 Cellulitis of left lower limb: Secondary | ICD-10-CM

## 2021-03-22 MED ORDER — TETANUS-DIPHTH-ACELL PERTUSSIS 5-2.5-18.5 LF-MCG/0.5 IM SUSY
0.5000 mL | PREFILLED_SYRINGE | Freq: Once | INTRAMUSCULAR | Status: AC
  Administered 2021-03-22: 0.5 mL via INTRAMUSCULAR

## 2021-03-22 MED ORDER — DOXYCYCLINE HYCLATE 100 MG PO CAPS: 100.0000 mg | ORAL_CAPSULE | Freq: Two times a day (BID) | ORAL | 0 refills | Status: AC

## 2021-03-22 NOTE — ED Provider Notes (Signed)
[facility]    CSN: [id number] Arrival date & time: 03/22/21  0817      History   Chief Complaint Chief Complaint  Patient presents with   Laceration    HPI Patrick Walls. is a 45 y.o. male history of hypertension, presenting today for evaluation of the wound.  Reports approximately 1 week ago dropped a box cutter onto his foot causing a laceration.  Reports that wound has not healed and is concerned about infection.  Has developed increased redness pain and swelling around the wound over the past couple days.  Denies fevers.  Last tetanus 2016.  HPI  Past Medical History:  Diagnosis Date   GERD (gastroesophageal reflux disease)    History of hiatal hernia     Patient Active Problem List   Diagnosis Date Noted   Anxiety 06/30/2020   Essential hypertension 06/30/2020   Family history of heart disease 03/12/2020   OSA (obstructive sleep apnea) 03/12/2020   Traumatic rotator cuff tear, right, initial encounter 10/17/2017   Rotator cuff syndrome 10/17/2017   Enlarged lymph node 04/20/2016   Lumbago with sciatica, left side 01/03/2016   HEARTBURN 11/01/2009    Past Surgical History:  Procedure Laterality Date   ESOPHAGOGASTRODUODENOSCOPY     NO PAST SURGERIES     SHOULDER OPEN ROTATOR CUFF REPAIR Right 10/17/2017   Procedure: Open acromionectomy and right shoulder rotator cuff repair with graft;  Surgeon: Latanya Maudlin, MD;  Location: [facility];  Service: Orthopedics;  Laterality: Right;  Interscalene Block       Home Medications    Prior to Admission medications   Medication Sig Start Date End Date Taking? Authorizing Provider  doxycycline (VIBRAMYCIN) 100 MG capsule Take 1 capsule (100 mg total) by mouth 2 (two) times daily for 7 days. 03/22/21 03/29/21 Yes Lenda Baratta C, PA-C  AMBULATORY NON FORMULARY MEDICATION Medication Name: CPAP with humidifier and supplies.  Diagnosis obstructive sleep apnea.  AHI of 10.  Please see attached copy of sleep  study. 09/22/20   Hali Marry, MD  lisinopril (ZESTRIL) 10 MG tablet Take 1 tablet (10 mg total) by mouth daily. 09/21/20   Hali Marry, MD  naproxen (NAPROSYN) 500 MG tablet Take 1 tablet (500 mg total) by mouth 2 (two) times daily with a meal. 12/19/20   Jaynee Eagles, PA-C    Family History Family History  Problem Relation Age of Onset   Heart failure Father    Heart attack Father 39   Heart attack Paternal Uncle     Social History Social History   Tobacco Use   Smoking status: Some Days    Packs/day: 0.25    Pack years: 0.00    Types: Cigarettes   Smokeless tobacco: Never  Vaping Use   Vaping Use: Never used  Substance Use Topics   Alcohol use: Yes    Comment: occasional   Drug use: No     Allergies   Paxil [paroxetine]   Review of Systems Review of Systems  Constitutional:  Negative for fatigue and fever.  Eyes:  Negative for redness, itching and visual disturbance.  Respiratory:  Negative for shortness of breath.   Cardiovascular:  Negative for chest pain and leg swelling.  Gastrointestinal:  Negative for nausea and vomiting.  Musculoskeletal:  Negative for arthralgias and myalgias.  Skin:  Positive for color change and wound. Negative for rash.  Neurological:  Negative for dizziness, syncope, weakness, light-headedness and headaches.    Physical Exam Triage  Vital Signs ED Triage Vitals  Enc Vitals Group     BP      Pulse      Resp      Temp      Temp src      SpO2      Weight      Height      Head Circumference      Peak Flow      Pain Score      Pain Loc      Pain Edu?      Excl. in [place]?    No data found.  Updated Vital Signs There were no vitals taken for this visit.  Visual Acuity Right Eye Distance:   Left Eye Distance:   Bilateral Distance:    Right Eye Near:   Left Eye Near:    Bilateral Near:     Physical Exam Vitals and nursing note reviewed.  Constitutional:      Appearance: He is well-developed.      Comments: No acute distress  HENT:     Head: Normocephalic and atraumatic.     Nose: Nose normal.  Eyes:     Conjunctiva/sclera: Conjunctivae normal.  Cardiovascular:     Rate and Rhythm: Normal rate.  Pulmonary:     Effort: Pulmonary effort is normal. No respiratory distress.  Abdominal:     General: There is no distension.  Musculoskeletal:        General: Normal range of motion.     Cervical back: Neck supple.  Skin:    General: Skin is warm and dry.     Comments: Dorsum of left foot with scabbed wound with surrounding redness erythema warmth and tenderness, does not extend onto lower leg, dorsalis pedis 2+, sensation intact distally  Neurological:     Mental Status: He is alert and oriented to person, place, and time.     UC Treatments / Results  Labs (all labs ordered are listed, but only abnormal results are displayed) Labs Reviewed - No data to display  EKG   Radiology No results found.  Procedures Procedures (including critical care time)  Medications Ordered in UC Medications  Tdap (BOOSTRIX) injection 0.5 mL (has no administration in time range)    Initial Impression / Assessment and Plan / UC Course  I have reviewed the triage vital signs and the nursing notes.  Pertinent labs & imaging results that were available during my care of the patient were reviewed by me and considered in my medical decision making (see chart for details).     Cellulitis/wound infection-no sign of abscess, covering with doxycycline, recommending elevation warm compresses and anti-inflammatories, tetanus updated today, monitor for gradual resolution,Discussed strict return precautions. Patient verbalized understanding and is agreeable with plan.  Final Clinical Impressions(s) / UC Diagnoses   Final diagnoses:  Cellulitis of left foot     Discharge Instructions      Doxycycline twice daily for 1 week Tylenol and ibuprofen for pain and swelling Elevate and apply  heat Tetanus updated today Follow-up if not improving or worsening     ED Prescriptions     Medication Sig Dispense Auth. Provider   doxycycline (VIBRAMYCIN) 100 MG capsule Take 1 capsule (100 mg total) by mouth 2 (two) times daily for 7 days. 14 capsule Sharnita Bogucki, [place] C, PA-C      PDMP not reviewed this encounter.   Janith Lima, [place] 03/22/21 [phone]

## 2021-03-22 NOTE — ED Triage Notes (Signed)
Patient presents to Urgent Care with complaints of a small laceration to foot. He states this was from a box cutter a week ago. Pain has not resolved and concerned with infection.

## 2021-03-22 NOTE — Discharge Instructions (Addendum)
Doxycycline twice daily for 1 week Tylenol and ibuprofen for pain and swelling Elevate and apply heat Tetanus updated today Follow-up if not improving or worsening

## 2021-03-23 ENCOUNTER — Other Ambulatory Visit: Payer: Self-pay | Admitting: Family Medicine

## 2021-03-23 DIAGNOSIS — I10 Essential (primary) hypertension: Secondary | ICD-10-CM

## 2021-03-28 ENCOUNTER — Telehealth: Payer: Self-pay | Admitting: Cardiology

## 2021-03-28 NOTE — Telephone Encounter (Signed)
New message:      Patient calling concering his sleep machine, patient needs a new mask. Please advise.

## 2021-03-28 NOTE — Telephone Encounter (Signed)
Reached out to the patient and gave him the number to the resupply department to order his supplies. [phone]. Pt is aware and agreeable to treatment.

## 2021-03-31 ENCOUNTER — Ambulatory Visit: Payer: Medicaid Other | Admitting: Cardiology

## 2021-04-01 ENCOUNTER — Ambulatory Visit: Payer: Medicaid Other | Admitting: Cardiology

## 2021-04-08 DIAGNOSIS — G4733 Obstructive sleep apnea (adult) (pediatric): Secondary | ICD-10-CM | POA: Diagnosis not present

## 2021-08-24 DIAGNOSIS — G4733 Obstructive sleep apnea (adult) (pediatric): Secondary | ICD-10-CM | POA: Diagnosis not present

## 2021-10-07 ENCOUNTER — Other Ambulatory Visit: Payer: Self-pay | Admitting: Family Medicine

## 2021-10-07 ENCOUNTER — Telehealth: Payer: Self-pay

## 2021-10-07 DIAGNOSIS — I10 Essential (primary) hypertension: Secondary | ICD-10-CM

## 2021-10-07 NOTE — Telephone Encounter (Signed)
Please contact patient to schedule a office visit for further refills on medication.

## 2021-10-07 NOTE — Telephone Encounter (Signed)
Patient has been scheduled with PCP on 10/17/21.

## 2021-10-17 ENCOUNTER — Other Ambulatory Visit: Payer: Self-pay

## 2021-10-17 ENCOUNTER — Encounter: Payer: Self-pay | Admitting: Family Medicine

## 2021-10-17 ENCOUNTER — Ambulatory Visit (INDEPENDENT_AMBULATORY_CARE_PROVIDER_SITE_OTHER): Payer: BC Managed Care – PPO | Admitting: Family Medicine

## 2021-10-17 VITALS — BP 122/76 | HR 64 | Resp 18 | Ht 69.0 in | Wt 228.0 lb

## 2021-10-17 DIAGNOSIS — D171 Benign lipomatous neoplasm of skin and subcutaneous tissue of trunk: Secondary | ICD-10-CM

## 2021-10-17 DIAGNOSIS — I10 Essential (primary) hypertension: Secondary | ICD-10-CM

## 2021-10-17 DIAGNOSIS — G4733 Obstructive sleep apnea (adult) (pediatric): Secondary | ICD-10-CM | POA: Diagnosis not present

## 2021-10-17 DIAGNOSIS — Z13220 Encounter for screening for lipoid disorders: Secondary | ICD-10-CM

## 2021-10-17 MED ORDER — LISINOPRIL 10 MG PO TABS: 10.0000 mg | ORAL_TABLET | Freq: Every day | ORAL | 3 refills | Status: DC

## 2021-10-17 NOTE — Assessment & Plan Note (Signed)
Well controlled. Continue current regimen. Follow up in  6 mo

## 2021-10-17 NOTE — Progress Notes (Signed)
Established Patient Office Visit  Subjective:  Patient ID: Patrick Walls., male    DOB: 1975-10-04  Age: 46 y.o. MRN: [id number]  CC:  Chief Complaint  Patient presents with   Hypertension    Follow up    Cyst    On chest for 2 years, slight increase in size.     HPI [name]. presents for   Hypertension- Pt denies chest pain, SOB, dizziness, or heart palpitations.  Taking meds as directed w/o problems.  Denies medication side effects.    F/U OSA - still struggling with sleeping with it on. Was able to sleep with it on for about 5.5 hours last night baut often it is less than that. Says often wakes up and it is across the room.    Also has a lesion of her left clavicle he would like me to look at today.  I have looked at it a couple different times but he reports that he feels it is getting just a little bit larger.  Past Medical History:  Diagnosis Date   GERD (gastroesophageal reflux disease)    History of hiatal hernia     Past Surgical History:  Procedure Laterality Date   ESOPHAGOGASTRODUODENOSCOPY     NO PAST SURGERIES     SHOULDER OPEN ROTATOR CUFF REPAIR Right 10/17/2017   Procedure: Open acromionectomy and right shoulder rotator cuff repair with graft;  Surgeon: Latanya Maudlin, MD;  Location: [facility];  Service: Orthopedics;  Laterality: Right;  Interscalene Block    Family History  Problem Relation Age of Onset   Heart failure Father    Heart attack Father 66   Heart attack Paternal Uncle     Social History   Socioeconomic History   Marital status: Divorced    Spouse name: Not on file   Number of children: Not on file   Years of education: Not on file   Highest education level: Not on file  Occupational History   Not on file  Tobacco Use   Smoking status: Every Day    Packs/day: 0.25    Types: Cigarettes   Smokeless tobacco: Never   Tobacco comments:    10 cigarettes a day   Vaping Use   Vaping Use: Never used  Substance  and Sexual Activity   Alcohol use: Yes    Comment: occasional   Drug use: No   Sexual activity: Yes  Other Topics Concern   Not on file  Social History Narrative   Not on file   Social Determinants of Health   Financial Resource Strain: Not on file  Food Insecurity: Not on file  Transportation Needs: Not on file  Physical Activity: Not on file  Stress: Not on file  Social Connections: Not on file  Intimate Partner Violence: Not on file    Outpatient Medications Prior to Visit  Medication Sig Dispense Refill   AMBULATORY NON FORMULARY MEDICATION Medication Name: CPAP with humidifier and supplies.  Diagnosis obstructive sleep apnea.  AHI of 10.  Please see attached copy of sleep study. 1 Units 0   lisinopril (ZESTRIL) 10 MG tablet TAKE 1 TABLET BY MOUTH EVERY DAY 30 tablet 0   naproxen (NAPROSYN) 500 MG tablet Take 1 tablet (500 mg total) by mouth 2 (two) times daily with a meal. 30 tablet 0   No facility-administered medications prior to visit.    Allergies  Allergen Reactions   Paxil [Paroxetine] Rash    ROS Review of  Systems    Objective:    Physical Exam Constitutional:      Appearance: Normal appearance. He is well-developed.  HENT:     Head: Normocephalic and atraumatic.  Cardiovascular:     Rate and Rhythm: Normal rate and regular rhythm.     Heart sounds: Normal heart sounds.  Pulmonary:     Effort: Pulmonary effort is normal.     Breath sounds: Normal breath sounds.  Skin:    General: Skin is warm and dry.  Neurological:     Mental Status: He is alert and oriented to person, place, and time. Mental status is at baseline.  Psychiatric:        Behavior: Behavior normal.    BP 122/76    Pulse 64    Resp 18    Ht 5' 9"  (1.753 m)    Wt 228 lb (103.4 kg)    SpO2 98%    BMI 33.67 kg/m  Wt Readings from Last 3 Encounters:  10/17/21 228 lb (103.4 kg)  09/21/20 229 lb (103.9 kg)  06/30/20 230 lb (104.3 kg)     Health Maintenance Due  Topic Date Due    HIV Screening  Never done   Hepatitis C Screening  Never done    There are no preventive care reminders to display for this patient.  No results found for: TSH Lab Results  Component Value Date   WBC 11.5 (H) 01/25/2018   HGB 15.0 01/25/2018   HCT 44.7 01/25/2018   MCV 95.4 01/25/2018   PLT 293 01/25/2018   Lab Results  Component Value Date   NA 140 09/21/2020   K 4.4 09/21/2020   CO2 29 09/21/2020   GLUCOSE 82 09/21/2020   BUN 18 09/21/2020   CREATININE 0.82 09/21/2020   BILITOT 0.8 07/05/2020   ALKPHOS 55 01/25/2018   AST 25 07/05/2020   ALT 40 07/05/2020   PROT 6.6 07/05/2020   ALBUMIN 4.8 01/25/2018   CALCIUM 9.8 09/21/2020   ANIONGAP 10 01/25/2018   Lab Results  Component Value Date   CHOL 161 07/05/2020   Lab Results  Component Value Date   HDL 50 07/05/2020   Lab Results  Component Value Date   LDLCALC 91 07/05/2020   Lab Results  Component Value Date   TRIG 104 07/05/2020   Lab Results  Component Value Date   CHOLHDL 3.2 07/05/2020   No results found for: HGBA1C    Assessment & Plan:   Problem List Items Addressed This Visit       Cardiovascular and Mediastinum   Essential hypertension - Primary    Well controlled. Continue current regimen. Follow up in  6 mo       Relevant Medications   lisinopril (ZESTRIL) 10 MG tablet   Other Relevant Orders   Lipid Panel w/reflex Direct LDL   COMPLETE METABOLIC PANEL WITH GFR   CBC   Hepatitis C Antibody   HIV Antibody (routine testing w rflx)     Respiratory   OSA (obstructive sleep apnea)    Encouraged him to to continue to try to wear the mask as much as he can.  He does have severe obstructive sleep apnea.  He did have some billing issues with the company that he was working with.  He will have to call them back directly and see what is going on and figure out what why the bill is not being covered.      Other Visit Diagnoses  Screening, lipid       Relevant Orders   Lipid Panel  w/reflex Direct LDL   COMPLETE METABOLIC PANEL WITH GFR   CBC   Hepatitis C Antibody   HIV Antibody (routine testing w rflx)   Lipoma of torso          Has what feels like a lipoma over the left medial clavicle area.  It is well-circumscribed with a smooth surface and soft like fatty tissue.  We discussed possible removal since he does feel like it is getting larger and it is already probably about 3-1/2 cm in size.  He says he will consider it but does not want to do it right now which is perfectly fine just let us know  Meds ordered this encounter  Medications   lisinopril (ZESTRIL) 10 MG tablet    Sig: Take 1 tablet (10 mg total) by mouth daily.    Dispense:  90 tablet    Refill:  3    Patient is due for appointment for further refills.    Follow-up: Return in about 6 months (around 04/16/2022) for Hypertension.    Beatrice Lecher, MD

## 2021-10-17 NOTE — Assessment & Plan Note (Signed)
Encouraged him to to continue to try to wear the mask as much as he can.  He does have severe obstructive sleep apnea.  He did have some billing issues with the company that he was working with.  He will have to call them back directly and see what is going on and figure out what why the bill is not being covered.

## 2021-10-18 LAB — COMPLETE METABOLIC PANEL WITH GFR
AG Ratio: 2.2 (calc) (ref 1.0–2.5)
ALT: 37 U/L (ref 9–46)
AST: 31 U/L (ref 10–40)
Albumin: 4.7 g/dL (ref 3.6–5.1)
Alkaline phosphatase (APISO): 62 U/L (ref 36–130)
BUN: 16 mg/dL (ref 7–25)
CO2: 29 mmol/L (ref 20–32)
Calcium: 9.9 mg/dL (ref 8.6–10.3)
Chloride: 106 mmol/L (ref 98–110)
Creat: 0.85 mg/dL (ref 0.60–1.29)
Globulin: 2.1 g/dL (calc) (ref 1.9–3.7)
Glucose, Bld: 95 mg/dL (ref 65–139)
Potassium: 4 mmol/L (ref 3.5–5.3)
Sodium: 139 mmol/L (ref 135–146)
Total Bilirubin: 0.5 mg/dL (ref 0.2–1.2)
Total Protein: 6.8 g/dL (ref 6.1–8.1)
eGFR: 108 mL/min/{1.73_m2} (ref >=60)

## 2021-10-18 LAB — LIPID PANEL W/REFLEX DIRECT LDL
Cholesterol: 164 mg/dL (ref <200)
HDL: 59 mg/dL (ref >=40)
LDL Cholesterol (Calc): 85 mg/dL (calc)
Non-HDL Cholesterol (Calc): 105 mg/dL (calc) (ref <130)
Total CHOL/HDL Ratio: 2.8 (calc) (ref <5.0)
Triglycerides: 106 mg/dL (ref <150)

## 2021-10-18 LAB — CBC
HCT: 43.5 % (ref 38.5–50.0)
Hemoglobin: 14.5 g/dL (ref 13.2–17.1)
MCH: 30.9 pg (ref 27.0–33.0)
MCHC: 33.3 g/dL (ref 32.0–36.0)
MCV: 92.8 fL (ref 80.0–100.0)
MPV: 10.6 fL (ref 7.5–12.5)
Platelets: 330 10*3/uL (ref 140–400)
RBC: 4.69 10*6/uL (ref 4.20–5.80)
RDW: 11.8 % (ref 11.0–15.0)
WBC: 5.9 10*3/uL (ref 3.8–10.8)

## 2021-10-18 LAB — HEPATITIS C ANTIBODY
Hepatitis C Ab: NONREACTIVE
SIGNAL TO CUT-OFF: 0.05 (ref <1.00)

## 2021-10-18 LAB — HIV ANTIBODY (ROUTINE TESTING W REFLEX): HIV 1&2 Ab, 4th Generation: NONREACTIVE

## 2021-10-18 NOTE — Progress Notes (Signed)
Hi Dale, labs so far look good. Still a couple are pending.

## 2021-10-19 NOTE — Progress Notes (Signed)
Hi Patrick Walls, all labs look great and negative for HIV and hepatitis C.

## 2021-11-29 DIAGNOSIS — G4733 Obstructive sleep apnea (adult) (pediatric): Secondary | ICD-10-CM | POA: Diagnosis not present

## 2022-04-17 ENCOUNTER — Ambulatory Visit: Payer: BC Managed Care – PPO | Admitting: Family Medicine

## 2022-04-24 ENCOUNTER — Ambulatory Visit: Payer: BC Managed Care – PPO | Admitting: Family Medicine

## 2022-05-31 ENCOUNTER — Ambulatory Visit: Payer: BC Managed Care – PPO | Admitting: Family Medicine

## 2022-05-31 ENCOUNTER — Encounter: Payer: Self-pay | Admitting: Family Medicine

## 2022-05-31 VITALS — BP 125/74 | HR 89 | Wt 227.0 lb

## 2022-05-31 DIAGNOSIS — Z87442 Personal history of urinary calculi: Secondary | ICD-10-CM | POA: Insufficient documentation

## 2022-05-31 DIAGNOSIS — I10 Essential (primary) hypertension: Secondary | ICD-10-CM | POA: Diagnosis not present

## 2022-05-31 DIAGNOSIS — G4733 Obstructive sleep apnea (adult) (pediatric): Secondary | ICD-10-CM

## 2022-05-31 NOTE — Progress Notes (Signed)
   Established Patient Office Visit  Subjective   Patient ID: Patrick Oki., male    DOB: 12/15/1975  Age: 45 y.o. MRN: [id number]  Chief Complaint  Patient presents with   Hypertension   Sleep Apnea    HPI\  Hypertension- Pt denies chest pain, SOB, dizziness, or heart palpitations.  Taking meds as directed w/o problems.  Denies medication side effects.    F/O OSA - he hasn't been wearing the CPAP much lately.  Has a hard time keeping it on all night.      ROS    Objective:     BP 125/74   Pulse 89   Wt 227 lb (103 kg)   SpO2 98%   BMI 33.52 kg/m    Physical Exam Constitutional:      Appearance: He is well-developed.  HENT:     Head: Normocephalic and atraumatic.  Cardiovascular:     Rate and Rhythm: Normal rate and regular rhythm.     Heart sounds: Normal heart sounds.  Pulmonary:     Effort: Pulmonary effort is normal.     Breath sounds: Normal breath sounds.  Skin:    General: Skin is warm and dry.  Neurological:     Mental Status: He is alert and oriented to person, place, and time.  Psychiatric:        Behavior: Behavior normal.      No results found for any visits on 05/31/22.    The 10-year ASCVD risk score (Arnett DK, et al., 2019) is: 4.3%    Assessment & Plan:   Problem List Items Addressed This Visit       Cardiovascular and Mediastinum   Essential hypertension - Primary    Well controlled. Continue current regimen. Follow up in  6 mo  Due for BMP      Relevant Orders   BASIC METABOLIC PANEL WITH GFR     Respiratory   OSA (obstructive sleep apnea)    Just really encouraged him to work on trying to wear his CPAP a little bit more consistently he has severe sleep apnea with an AHI of 45 so it really is important that he wears it.  He does notice that he has less headaches when he wears his CPAP.  He is getting some new supplies then.        Other   History of kidney stones    Return in about 6 months (around  12/11/2022) for Hypertension.    Beatrice Lecher, MD

## 2022-05-31 NOTE — Assessment & Plan Note (Signed)
Just really encouraged him to work on trying to wear his CPAP a little bit more consistently he has severe sleep apnea with an AHI of 45 so it really is important that he wears it.  He does notice that he has less headaches when he wears his CPAP.  He is getting some new supplies then.

## 2022-05-31 NOTE — Assessment & Plan Note (Signed)
Well controlled. Continue current regimen. Follow up in  6 mo . Due for BMP 

## 2022-06-01 LAB — BASIC METABOLIC PANEL WITH GFR
BUN: 19 mg/dL (ref 7–25)
CO2: 26 mmol/L (ref 20–32)
Calcium: 10 mg/dL (ref 8.6–10.3)
Chloride: 106 mmol/L (ref 98–110)
Creat: 0.93 mg/dL (ref 0.60–1.29)
Glucose, Bld: 115 mg/dL — ABNORMAL HIGH (ref 65–99)
Potassium: 4.3 mmol/L (ref 3.5–5.3)
Sodium: 140 mmol/L (ref 135–146)
eGFR: 102 mL/min/{1.73_m2} (ref >=60)

## 2022-06-01 NOTE — Progress Notes (Signed)
Your lab work is within acceptable range and there are no concerning findings.   ?

## 2022-06-02 DIAGNOSIS — G4733 Obstructive sleep apnea (adult) (pediatric): Secondary | ICD-10-CM | POA: Diagnosis not present

## 2022-11-02 ENCOUNTER — Other Ambulatory Visit: Payer: Self-pay | Admitting: Family Medicine

## 2022-11-02 DIAGNOSIS — I10 Essential (primary) hypertension: Secondary | ICD-10-CM

## 2022-11-29 ENCOUNTER — Ambulatory Visit (INDEPENDENT_AMBULATORY_CARE_PROVIDER_SITE_OTHER): Payer: 59 | Admitting: Family Medicine

## 2022-11-29 ENCOUNTER — Encounter: Payer: Self-pay | Admitting: Family Medicine

## 2022-11-29 VITALS — BP 126/77 | HR 75 | Temp 97.6°F | Wt 228.5 lb

## 2022-11-29 DIAGNOSIS — D171 Benign lipomatous neoplasm of skin and subcutaneous tissue of trunk: Secondary | ICD-10-CM

## 2022-11-29 DIAGNOSIS — I10 Essential (primary) hypertension: Secondary | ICD-10-CM

## 2022-11-29 DIAGNOSIS — G4733 Obstructive sleep apnea (adult) (pediatric): Secondary | ICD-10-CM

## 2022-11-29 DIAGNOSIS — Z1211 Encounter for screening for malignant neoplasm of colon: Secondary | ICD-10-CM | POA: Diagnosis not present

## 2022-11-29 DIAGNOSIS — L821 Other seborrheic keratosis: Secondary | ICD-10-CM

## 2022-11-29 MED ORDER — LISINOPRIL 10 MG PO TABS: 10.0000 mg | ORAL_TABLET | Freq: Every day | ORAL | 1 refills | Status: DC

## 2022-11-29 NOTE — Progress Notes (Signed)
   Established Patient Office Visit  Subjective   Patient ID: Patrick Walls., male    DOB: [birth year]  Age: 47 y.o. MRN: [id number]  Chief Complaint  Patient presents with   Hypertension    HPI Hypertension- Pt denies chest pain, SOB, dizziness, or heart palpitations.  Taking meds as directed w/o problems.  Denies medication side effects.  Hasn't taken his pill this A<M.  He has a very physically active job but does not exercise outside of that.  He did want me to recheck the lump on his left collar bone.    Also has a darker mole on the right side of his neck.   Follow-up OSA-he has been trying to wear his CPAP lately he did not wear it for few weeks and started getting headaches and feeling more tired.  He does not usually keep it on all night he will usually wake up and it is on the floor.    ROS    Objective:     BP 126/77   Pulse 75   Temp 97.6 F (36.4 C) (Oral)   Wt 228 lb 8 oz (103.6 kg)   BMI 33.74 kg/m    Physical Exam Constitutional:      Appearance: He is well-developed.  HENT:     Head: Normocephalic and atraumatic.  Cardiovascular:     Rate and Rhythm: Normal rate and regular rhythm.     Heart sounds: Normal heart sounds.  Pulmonary:     Effort: Pulmonary effort is normal.     Breath sounds: Normal breath sounds.  Skin:    General: Skin is warm and dry.     Comments: On right side of neck there is a small 5 mm oval shaped black seborrheic keratosis.  Just lateral nad posterior is a sebaceous cyst.  On the left collar bone there eis a 4 x 5 cm lipoma.   Neurological:     Mental Status: He is alert and oriented to person, place, and time.  Psychiatric:        Behavior: Behavior normal.      No  results found for any visits on 11/29/22.    The 10-year ASCVD risk score (Arnett DK, et al., 2019) is: 4.7%    Assessment & Plan:   Problem List Items Addressed This Visit       Cardiovascular and Mediastinum   Essential hypertension - Primary    Recommend repeat blood pressure.  Repeat blood pressure looks fantastic continue current regimen.  Follow-up in 6 to 9 months.  Will get updated lab work today now that he has new insurance.      Relevant Medications   lisinopril (ZESTRIL) 10 MG tablet   Other Relevant Orders   Lipid Panel w/reflex Direct LDL   COMPLETE METABOLIC PANEL WITH GFR   CBC     Respiratory   OSA (obstructive sleep apnea)    Encouraged to continue to work on wearing CPAP consistently.      Other Visit Diagnoses     Screening for colon cancer       Lipoma of torso       Seborrheic keratosis           Return in about 7 months (around 06/15/2023) for Hypertension.    Beatrice Lecher, MD

## 2022-11-29 NOTE — Assessment & Plan Note (Signed)
Encouraged to continue to work on wearing CPAP consistently.

## 2022-11-29 NOTE — Patient Instructions (Addendum)
The soft swelling over your collar bone is called a Lipoma.

## 2022-11-29 NOTE — Assessment & Plan Note (Addendum)
Recommend repeat blood pressure.  Repeat blood pressure looks fantastic continue current regimen.  Follow-up in 6 to 9 months.  Will get updated lab work today now that he has new insurance.

## 2022-11-30 LAB — COMPLETE METABOLIC PANEL WITH GFR
AG Ratio: 1.8 (calc) (ref 1.0–2.5)
ALT: 42 U/L (ref 9–46)
AST: 33 U/L (ref 10–40)
Albumin: 4.6 g/dL (ref 3.6–5.1)
Alkaline phosphatase (APISO): 65 U/L (ref 36–130)
BUN: 18 mg/dL (ref 7–25)
CO2: 28 mmol/L (ref 20–32)
Calcium: 9.9 mg/dL (ref 8.6–10.3)
Chloride: 106 mmol/L (ref 98–110)
Creat: 0.8 mg/dL (ref 0.60–1.29)
Globulin: 2.5 g/dL (calc) (ref 1.9–3.7)
Glucose, Bld: 96 mg/dL (ref 65–99)
Potassium: 4.5 mmol/L (ref 3.5–5.3)
Sodium: 142 mmol/L (ref 135–146)
Total Bilirubin: 0.5 mg/dL (ref 0.2–1.2)
Total Protein: 7.1 g/dL (ref 6.1–8.1)
eGFR: 109 mL/min/{1.73_m2} (ref >=60)

## 2022-11-30 LAB — CBC
HCT: 44.1 % (ref 38.5–50.0)
Hemoglobin: 14.6 g/dL (ref 13.2–17.1)
MCH: 30.8 pg (ref 27.0–33.0)
MCHC: 33.1 g/dL (ref 32.0–36.0)
MCV: 93 fL (ref 80.0–100.0)
MPV: 10.1 fL (ref 7.5–12.5)
Platelets: 343 10*3/uL (ref 140–400)
RBC: 4.74 10*6/uL (ref 4.20–5.80)
RDW: 11.8 % (ref 11.0–15.0)
WBC: 8.3 10*3/uL (ref 3.8–10.8)

## 2022-11-30 LAB — LIPID PANEL W/REFLEX DIRECT LDL
Cholesterol: 184 mg/dL (ref <200)
HDL: 62 mg/dL (ref >=40)
LDL Cholesterol (Calc): 103 mg/dL (calc) — ABNORMAL HIGH
Non-HDL Cholesterol (Calc): 122 mg/dL (calc) (ref <130)
Total CHOL/HDL Ratio: 3 (calc) (ref <5.0)
Triglycerides: 99 mg/dL (ref <150)

## 2022-11-30 NOTE — Progress Notes (Signed)
Hi Patrick Walls, LDL cholesterol is up some.  It did look better last year.  Do you to work on healthy diet and regular exercise.  All other labs look great.

## 2023-06-15 ENCOUNTER — Ambulatory Visit (INDEPENDENT_AMBULATORY_CARE_PROVIDER_SITE_OTHER): Payer: 59 | Admitting: Family Medicine

## 2023-06-15 ENCOUNTER — Encounter: Payer: Self-pay | Admitting: Family Medicine

## 2023-06-15 VITALS — BP 111/55 | HR 68 | Ht 69.0 in | Wt 218.0 lb

## 2023-06-15 DIAGNOSIS — L720 Epidermal cyst: Secondary | ICD-10-CM

## 2023-06-15 DIAGNOSIS — G4733 Obstructive sleep apnea (adult) (pediatric): Secondary | ICD-10-CM

## 2023-06-15 DIAGNOSIS — E785 Hyperlipidemia, unspecified: Secondary | ICD-10-CM | POA: Insufficient documentation

## 2023-06-15 DIAGNOSIS — D171 Benign lipomatous neoplasm of skin and subcutaneous tissue of trunk: Secondary | ICD-10-CM | POA: Diagnosis not present

## 2023-06-15 DIAGNOSIS — I10 Essential (primary) hypertension: Secondary | ICD-10-CM | POA: Diagnosis not present

## 2023-06-15 DIAGNOSIS — F17200 Nicotine dependence, unspecified, uncomplicated: Secondary | ICD-10-CM

## 2023-06-15 MED ORDER — LISINOPRIL 10 MG PO TABS: 10.0000 mg | ORAL_TABLET | Freq: Every day | ORAL | 3 refills | Status: DC

## 2023-06-15 NOTE — Assessment & Plan Note (Signed)
Severe OSA: Using it some .  Getting his supplies.

## 2023-06-15 NOTE — Assessment & Plan Note (Signed)
He has really changed his diet and is doing well. Would like to recheck his numbers.

## 2023-06-15 NOTE — Progress Notes (Signed)
   Established Patient Office Visit  Subjective   Patient ID: Patrick Liebler., male    DOB: 10-03-1975  Age: 47 y.o. MRN: [id number]  Chief Complaint  Patient presents with   Hypertension    HPI  Hypertension- Pt denies chest pain, SOB, dizziness, or heart palpitations.  Taking meds as directed w/o problems.  Denies medication side effects.    F/U OSA - using it some   F/U Lipids. He really changed his diet and cut out the greasy foods. He was also able to cut way back on his TUMs too.  Also lost weight down to 210lbs    Couple of skin lesion on Right neck would like removed.  The lesions he had actually had removed once but it came back.  He also has a softer lump over the left clavicle consistent with a lipoma.     ROS    Objective:     BP (!) 111/55   Pulse 68   Ht 5\' 9"  (1.753 m)   Wt 218 lb (98.9 kg)   SpO2 98%   BMI 32.19 kg/m    Physical Exam Vitals and nursing note reviewed.  Constitutional:      Appearance: Normal appearance.  HENT:     Head: Normocephalic and atraumatic.  Eyes:     Conjunctiva/sclera: Conjunctivae normal.  Cardiovascular:     Rate and Rhythm: Normal rate and regular rhythm.  Pulmonary:     Effort: Pulmonary effort is normal.     Breath sounds: Normal breath sounds.  Skin:    General: Skin is warm and dry.  Neurological:     Mental Status: He is alert.  Psychiatric:        Mood and Affect: Mood normal.      No results found for any visits on 06/15/23.    The 10-year ASCVD risk score (Arnett DK, et al., 2019) is: 4.2%    Assessment & Plan:   Problem List Items Addressed This Visit       Cardiovascular and Mediastinum   Essential hypertension - Primary   Relevant Medications   lisinopril (ZESTRIL) 10 MG tablet   Other Relevant Orders   Basic Metabolic Panel (BMET)   Lipid panel     Respiratory   OSA (obstructive sleep apnea)    Severe OSA: Using it some .  Getting his supplies.        Relevant  Medications   lisinopril (ZESTRIL) 10 MG tablet   Other Relevant Orders   Basic Metabolic Panel (BMET)   Lipid panel     Other   Smoker    Recommend quitting and Pneumonia shot.        Hyperlipidemia    He has really changed his diet and is doing well. Would like to recheck his numbers.       Relevant Medications   lisinopril (ZESTRIL) 10 MG tablet   Other Relevant Orders   Lipid panel   Other Visit Diagnoses     Epidermal cyst       Relevant Orders   Ambulatory referral to Dermatology   Lipoma of torso       Relevant Orders   Ambulatory referral to Dermatology       Epidermal skin lesions-recommend referral to dermatology for definitive removal.  Lipoma-typically referred to general surgery but he could also asked the dermatologist about it.  Return in about 6 months (around 12/25/2023).    Nani Gasser, MD

## 2023-06-15 NOTE — Assessment & Plan Note (Signed)
Recommend quitting and Pneumonia shot.

## 2023-06-16 LAB — BASIC METABOLIC PANEL
BUN/Creatinine Ratio: 18 (ref 9–20)
BUN: 14 mg/dL (ref 6–24)
CO2: 21 mmol/L (ref 20–29)
Calcium: 9.8 mg/dL (ref 8.7–10.2)
Chloride: 102 mmol/L (ref 96–106)
Creatinine, Ser: 0.8 mg/dL (ref 0.76–1.27)
Glucose: 88 mg/dL (ref 70–99)
Potassium: 4.7 mmol/L (ref 3.5–5.2)
Sodium: 136 mmol/L (ref 134–144)
eGFR: 109 mL/min/{1.73_m2} (ref >59)

## 2023-06-16 LAB — LIPID PANEL
Chol/HDL Ratio: 2.9 {ratio} (ref 0.0–5.0)
Cholesterol, Total: 159 mg/dL (ref 100–199)
HDL: 55 mg/dL (ref >39)
LDL Chol Calc (NIH): 78 mg/dL (ref 0–99)
Triglycerides: 153 mg/dL — ABNORMAL HIGH (ref 0–149)
VLDL Cholesterol Cal: 26 mg/dL (ref 5–40)

## 2023-06-18 NOTE — Progress Notes (Signed)
Patrick Walls, overall cholesterol panel looks good.  Triglycerides up a little bit so just continue to work on regular exercise and healthy diet.  Metabolic panel looks great.

## 2023-08-21 ENCOUNTER — Ambulatory Visit: Payer: Self-pay | Admitting: Family Medicine

## 2023-08-21 ENCOUNTER — Ambulatory Visit: Payer: 59 | Admitting: Family Medicine

## 2023-08-21 NOTE — Telephone Encounter (Signed)
Copied from CRM [id number]. Topic: Clinical - Red Word Triage >> Aug 21, 2023  8:35 AM Prudencio Pair wrote: Red Word that prompted transfer to Nurse Triage: Pt states that he was lifting something yesterday & felt something popped in his left bicep on arm. States his arm is swollen.   Chief Complaint: left biceps swelling and pain  Symptoms: swelling pain in left bicep area and left hand weak Frequency: ongoing since yesterday Pertinent Negatives: Patient denies numbness or tingling Disposition: [] ED /[] Urgent Care (no appt availability in office) / [x] Appointment(In office/virtual)/ []  [facility]/ [] Home Care/ [] Refused Recommended Disposition /[] [facility]/ []  Follow-up with PCP Additional Notes: patient states heard something pop in left arm yesterday while lifting up carpet: now experiencing pain, swelling and weakness in hand with movement.  Agent made appointment for in office visit today. Pt stated applied ice and taken pain medication without relief. Reason for Disposition  [1] SEVERE pain AND [2] not improved 2 hours after pain medicine/ice packs  Answer Assessment - Initial Assessment Questions 1. MECHANISM: "How did the injury happen?"    Lifting carpet and felt something pop 2. ONSET: "When did the injury happen?" (Minutes or hours ago)      Yesterday- can't use or causes 6/10 pain 3. LOCATION: "Where is the injury located?" "Which arm?"     Left biceps area and now left hand is weak when trying to lift something 4. APPEARANCE of INJURY: "What does the injury look like?"      No bruises 5. SEVERITY: "Can you use the arm normally?"       6. SWELLING or BRUISING: "is there any swelling or bruising?" If Yes, ask: "How large is it? (e.g., inches, centimeters)      No bruising but has swelling  7. PAIN: "Is there pain?" If Yes, ask: "How bad is the pain?"    (Scale 1-10; or mild, moderate, severe)   - NONE (0): No pain.   - MILD (1-3): Doesn't interfere with  normal activities.   - MODERATE (4-7): Interferes with normal activities (e.g., work or school) or awakens from sleep.   - SEVERE (8-10): Excruciating pain, unable to do any normal activities, unable to hold a cup of water.     6/10 with movement 8. TETANUS: For any breaks in the skin, ask: "When was the last tetanus booster?"     N/a 9. OTHER SYMPTOMS: "Do you have any other symptoms?"  (e.g., numbness in hand)     No numbness 10. PREGNANCY: "Is there any chance you are pregnant?" "When was your last menstrual period?"       no  Protocols used: Arm Injury-A-AH

## 2023-08-21 NOTE — Telephone Encounter (Signed)
Sending to the provider as an FYI -   Per [phone] - patient was scheduled for 08/28/23. Contacted the patient, states that he was scheduled today but had a work conflict and was reschedule for 08/28/23. As per patient, currently restricting movements to left arm, icing and taking ibuprofen as needed. Feels like he may have ruptured/torn a tendon or muscle. Patient has been rescheduled on 08/22/23 due to severity of injury and has confirmed to keep his appointment.

## 2023-08-22 ENCOUNTER — Ambulatory Visit (INDEPENDENT_AMBULATORY_CARE_PROVIDER_SITE_OTHER): Payer: 59

## 2023-08-22 ENCOUNTER — Encounter: Payer: Self-pay | Admitting: Family Medicine

## 2023-08-22 ENCOUNTER — Ambulatory Visit (INDEPENDENT_AMBULATORY_CARE_PROVIDER_SITE_OTHER): Payer: 59 | Admitting: Family Medicine

## 2023-08-22 VITALS — BP 122/75 | HR 83 | Ht 69.0 in | Wt 225.0 lb

## 2023-08-22 DIAGNOSIS — M25512 Pain in left shoulder: Secondary | ICD-10-CM

## 2023-08-22 DIAGNOSIS — M19012 Primary osteoarthritis, left shoulder: Secondary | ICD-10-CM | POA: Diagnosis not present

## 2023-08-22 DIAGNOSIS — S46212A Strain of muscle, fascia and tendon of other parts of biceps, left arm, initial encounter: Secondary | ICD-10-CM

## 2023-08-22 NOTE — Progress Notes (Signed)
   Acute Office Visit  Subjective:     Patient ID: Patrick Walls., male    DOB: 1975-10-31, 47 y.o.   MRN: [id number]  Chief Complaint  Patient presents with   Shoulder Pain    HPI Patient is in today for injury to his left bicep.  He heard a pop.  He has been icing it.   Picking up a large rug on Monday around 9 AM when he felt a sudden pop near his anterior shoulder.  Now it sore with range of motion and is difficult to lift things that are heavy.  He thinks he may have torn his bicep.  Had prior surgery on his right shoulder for an injury about 5 years ago.  ROS       Objective:    BP 122/75   Pulse 83   Ht 5\' 9"  (1.753 m)   Wt 225 lb (102.1 kg)   SpO2 98%   BMI 33.23 kg/m    Physical Exam Vitals reviewed.  Constitutional:      Appearance: Normal appearance.  HENT:     Head: Normocephalic.  Pulmonary:     Effort: Pulmonary effort is normal.  Musculoskeletal:     Comments: Left shoulder with normal range of motion but some discomfort anteriorly with full extension.  Normal strength with elbow flexion.  Pain with supination and slight weakness.  No pain or weakness with pronation.  Of the bicep muscle on exam.  Neurological:     Mental Status: He is alert and oriented to person, place, and time.  Psychiatric:        Mood and Affect: Mood normal.        Behavior: Behavior normal.     No results found for any visits on 08/22/23.      Assessment & Plan:   Problem List Items Addressed This Visit   None Visit Diagnoses     Acute pain of left shoulder    -  Primary   Relevant Orders   MR Shoulder Left Wo Contrast   DG Shoulder Left   Ambulatory referral to Orthopedic Surgery   Biceps muscle tear, left, initial encounter       Relevant Orders   MR Shoulder Left Wo Contrast   DG Shoulder Left   Ambulatory referral to Orthopedic Surgery      Most consistent with proximal bicep full tear.  Will get plain film x-ray and move forward with MRI.   Will go ahead and refer to Dr. Ramond Marrow for possible surgical intervention.  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  No orders of the defined types were placed in this encounter.   No follow-ups on file.  Nani Gasser, MD

## 2023-08-23 NOTE — Progress Notes (Signed)
Hi Dale, x-ray shows some arthritis at the ball-and-socket part of the joint as well as the [facility] joint in the shoulder.  Evidence of some old trauma as well.  We should be able to get going with the MRI so we will work on getting that approved.  Vanicia-can you go ahead and get the MRI approved.  I am hoping we can get him on the truck on Monday

## 2023-08-28 ENCOUNTER — Ambulatory Visit: Payer: 59 | Admitting: Family Medicine

## 2023-09-14 NOTE — Progress Notes (Signed)
Task completed. Auth for MRI was obtained on 08/27/23. The imaging department was notified to contact the patient for scheduling on the same day.

## 2023-09-28 DIAGNOSIS — S46212A Strain of muscle, fascia and tendon of other parts of biceps, left arm, initial encounter: Secondary | ICD-10-CM | POA: Diagnosis not present

## 2023-11-22 ENCOUNTER — Telehealth: Payer: Self-pay

## 2023-11-22 DIAGNOSIS — Z1212 Encounter for screening for malignant neoplasm of rectum: Secondary | ICD-10-CM

## 2023-11-22 NOTE — Telephone Encounter (Signed)
 Ok to order

## 2023-11-22 NOTE — Telephone Encounter (Signed)
 Copied from CRM [id number]. Topic: Referral - Request for Referral >> Nov 22, 2023  8:20 AM Nila Nephew wrote: Did the patient discuss referral with their provider in the last year? No  Appointment offered? No - Appt not usually needed?  Type of order/referral and detailed reason for visit: Cologuard  Preference of office, provider, location: None - Cologuard  If referral order, have you been seen by this specialty before? Yes  Can we respond through MyChart? No

## 2023-11-22 NOTE — Telephone Encounter (Signed)
 Cologuard order placed.

## 2023-11-23 NOTE — Telephone Encounter (Signed)
 Patient informed.

## 2023-12-25 ENCOUNTER — Ambulatory Visit: Payer: 59 | Admitting: Family Medicine

## 2023-12-31 ENCOUNTER — Ambulatory Visit (INDEPENDENT_AMBULATORY_CARE_PROVIDER_SITE_OTHER): Payer: Self-pay | Admitting: Family Medicine

## 2023-12-31 ENCOUNTER — Encounter: Payer: Self-pay | Admitting: Family Medicine

## 2023-12-31 VITALS — BP 114/66 | HR 87 | Ht 69.0 in | Wt 222.0 lb

## 2023-12-31 DIAGNOSIS — I10 Essential (primary) hypertension: Secondary | ICD-10-CM

## 2023-12-31 NOTE — Assessment & Plan Note (Signed)
 Well controlled. Continue current regimen. Follow up in  6 mo

## 2023-12-31 NOTE — Progress Notes (Signed)
   Established Patient Office Visit  Subjective  Patient ID: Coltan Spinello., male    DOB: June 05, 1976  Age: 48 y.o. MRN: [id number]  Chief Complaint  Patient presents with   Hypertension    HPI  Hypertension- Pt denies chest pain, SOB, dizziness, or heart palpitations.  Taking meds as directed w/o problems.  Denies medication side effects.       ROS    Objective:     BP 114/66   Pulse 87   Ht 5\' 9"  (1.753 m)   Wt 222 lb (100.7 kg)   SpO2 95%   BMI 32.78 kg/m    Physical Exam Vitals and nursing note reviewed.  Constitutional:      Appearance: Normal appearance.  HENT:     Head: Normocephalic and atraumatic.  Eyes:     Conjunctiva/sclera: Conjunctivae normal.  Cardiovascular:     Rate and Rhythm: Normal rate and regular rhythm.  Pulmonary:     Effort: Pulmonary effort is normal.     Breath sounds: Normal breath sounds.  Skin:    General: Skin is warm and dry.  Neurological:     Mental Status: He is alert.  Psychiatric:        Mood and Affect: Mood normal.      No results found for any visits on 12/31/23.    The 10-year ASCVD risk score (Arnett DK, et al., 2019) is: 4.4%    Assessment & Plan:   Problem List Items Addressed This Visit       Cardiovascular and Mediastinum   Essential hypertension - Primary   Well controlled. Continue current regimen. Follow up in  10mo       Relevant Orders   CMP14+EGFR    Has the cologuard box at home. Will will try to complete in the next week   Return in about 6 months (around 07/08/2024) for Wellness Exam.     Duaine German, MD

## 2024-01-01 ENCOUNTER — Encounter: Payer: Self-pay | Admitting: Family Medicine

## 2024-01-01 ENCOUNTER — Ambulatory Visit: Admitting: Physician Assistant

## 2024-01-01 LAB — CMP14+EGFR
ALT: 35 IU/L (ref 0–44)
AST: 26 IU/L (ref 0–40)
Albumin: 4.6 g/dL (ref 4.1–5.1)
Alkaline Phosphatase: 88 IU/L (ref 44–121)
BUN/Creatinine Ratio: 20 (ref 9–20)
BUN: 18 mg/dL (ref 6–24)
Bilirubin Total: 0.3 mg/dL (ref 0.0–1.2)
CO2: 22 mmol/L (ref 20–29)
Calcium: 10.1 mg/dL (ref 8.7–10.2)
Chloride: 105 mmol/L (ref 96–106)
Creatinine, Ser: 0.91 mg/dL (ref 0.76–1.27)
Globulin, Total: 1.9 g/dL (ref 1.5–4.5)
Glucose: 154 mg/dL — ABNORMAL HIGH (ref 70–99)
Potassium: 4.3 mmol/L (ref 3.5–5.2)
Sodium: 140 mmol/L (ref 134–144)
Total Protein: 6.5 g/dL (ref 6.0–8.5)
eGFR: 103 mL/min/{1.73_m2} (ref >59)

## 2024-01-01 NOTE — Telephone Encounter (Signed)
 Patient called. He wants to have his cholesterol checked.

## 2024-01-01 NOTE — Progress Notes (Signed)
 Your lab work is within acceptable range and there are no concerning findings.   ?

## 2024-01-04 LAB — LIPID PANEL
Chol/HDL Ratio: 2.7 ratio (ref 0.0–5.0)
Cholesterol, Total: 146 mg/dL (ref 100–199)
HDL: 54 mg/dL (ref >39)
LDL Chol Calc (NIH): 68 mg/dL (ref 0–99)
Triglycerides: 138 mg/dL (ref 0–149)
VLDL Cholesterol Cal: 24 mg/dL (ref 5–40)

## 2024-01-04 LAB — SPECIMEN STATUS REPORT

## 2024-01-04 NOTE — Progress Notes (Signed)
 Your lab work is within acceptable range and there are no concerning findings.   ?

## 2024-05-23 ENCOUNTER — Ambulatory Visit
Admission: EM | Admit: 2024-05-23 | Discharge: 2024-05-23 | Disposition: A | Discharge status: Home / Self Care | Attending: Family Medicine | Admitting: Family Medicine

## 2024-05-23 ENCOUNTER — Encounter: Payer: Self-pay | Admitting: Emergency Medicine

## 2024-05-23 DIAGNOSIS — R350 Frequency of micturition: Secondary | ICD-10-CM

## 2024-05-23 DIAGNOSIS — R81 Glycosuria: Secondary | ICD-10-CM

## 2024-05-23 DIAGNOSIS — R109 Unspecified abdominal pain: Secondary | ICD-10-CM | POA: Diagnosis not present

## 2024-05-23 LAB — POCT URINE DIPSTICK
Bilirubin, UA: NEGATIVE
Blood, UA: NEGATIVE
Glucose, UA: 100 mg/dL — AB
Ketones, POC UA: NEGATIVE mg/dL
Leukocytes, UA: NEGATIVE
Nitrite, UA: NEGATIVE
Protein Ur, POC: NEGATIVE mg/dL
Spec Grav, UA: 1.025 (ref 1.010–1.025)
Urobilinogen, UA: 0.2 U/dL
pH, UA: 6 (ref 5.0–8.0)

## 2024-05-23 LAB — GLUCOSE, POCT (MANUAL RESULT ENTRY): POCT Glucose (KUC): 133 mg/dL — AB (ref 70–99)

## 2024-05-23 MED ORDER — CYCLOBENZAPRINE HCL 10 MG PO TABS: 10.0000 mg | ORAL_TABLET | Freq: Two times a day (BID) | ORAL | 0 refills | Status: DC | PRN

## 2024-05-23 NOTE — Discharge Instructions (Signed)
 You were seen today for flank pain.  Your urine did not show evidence of infection or kidney stone.  This appears to be more muscular.  I have sent out a muscle relaxer to help with pain.  This will make you tired/drowsy so please take when home and not driving.  You may use a heating pad to the area as well.  Your blood work will be resulted over the weekend.  If anything is abnormal you will be notified.  Please return or be seen by your primary care provider if not improving.

## 2024-05-23 NOTE — ED Triage Notes (Signed)
 Pt presents c/o kidney pain x 7 days. Pt reports he has passed a kidney stone previously and the pain is sort of similar. Pt reports he had frequent urination yesterday but thinks the Tamsulosin  he took is what caused the frequent urination.

## 2024-05-23 NOTE — ED Provider Notes (Signed)
 [facility]    CSN: [id number] Arrival date & time: 05/23/24  0912      History   Chief Complaint Chief Complaint  Patient presents with   Flank Pain    HPI Patrick Walls. is a 48 y.o. male.    Flank Pain  Patient is here for pain at the right kidney for about a week.  He does have h/o kidney stones, but this does feel different to him.  He is having generalized discomfort on the right.  It can stop him at times if he turns a certain way.  He has taken aleve /tylenol  without much.  No heavy lifting, no twisting prior to this.  No fevers/chills.  No other urinary symptoms noted.   He did restart flomax  to see if helpful.  He has been increasing water intake.        Past Medical History:  Diagnosis Date   GERD (gastroesophageal reflux disease)    History of hiatal hernia     Patient Active Problem List   Diagnosis Date Noted   Smoker 06/15/2023   Hyperlipidemia 06/15/2023   History of kidney stones 05/31/2022   Anxiety 06/30/2020   Essential hypertension 06/30/2020   Family history of heart disease 03/12/2020   OSA (obstructive sleep apnea) 03/12/2020   Traumatic rotator cuff tear, right, initial encounter 10/17/2017   Rotator cuff syndrome 10/17/2017   Enlarged lymph node 04/20/2016   Lumbago with sciatica, left side 01/03/2016   HEARTBURN 11/01/2009    Past Surgical History:  Procedure Laterality Date   ESOPHAGOGASTRODUODENOSCOPY     NO PAST SURGERIES     SHOULDER OPEN ROTATOR CUFF REPAIR Right 10/17/2017   Procedure: Open acromionectomy and right shoulder rotator cuff repair with graft;  Surgeon: Heide Ingle, MD;  Location: [facility];  Service: Orthopedics;  Laterality: Right;  Interscalene Block       Home Medications    Prior to Admission medications   Medication Sig Start Date End Date Taking? Authorizing Provider  AMBULATORY NON FORMULARY MEDICATION Medication Name: CPAP with humidifier and supplies.  Diagnosis  obstructive sleep apnea.  AHI of 10.  Please see attached copy of sleep study. 09/22/20   Alvan Dorothyann BIRCH, MD  lisinopril  (ZESTRIL ) 10 MG tablet Take 1 tablet (10 mg total) by mouth daily. 06/15/23   Alvan Dorothyann BIRCH, MD    Family History Family History  Problem Relation Age of Onset   Heart failure Father    Heart attack Father 67   Heart attack Paternal Uncle     Social History Social History   Tobacco Use   Smoking status: Every Day    Current packs/day: 0.25    Types: Cigarettes    Passive exposure: Current   Smokeless tobacco: Never   Tobacco comments:    10 cigarettes a day   Vaping Use   Vaping status: Never Used  Substance Use Topics   Alcohol use: Yes    Comment: occasional   Drug use: No     Allergies   Paxil [paroxetine]   Review of Systems Review of Systems  Constitutional: Negative.   HENT: Negative.    Respiratory: Negative.    Cardiovascular: Negative.   Gastrointestinal: Negative.   Genitourinary:  Positive for flank pain.  Psychiatric/Behavioral: Negative.       Physical Exam Triage Vital Signs ED Triage Vitals  Encounter Vitals Group     BP 05/23/24 1105 (!) 138/92     Girls Systolic BP Percentile --  Girls Diastolic BP Percentile --      Boys Systolic BP Percentile --      Boys Diastolic BP Percentile --      Pulse Rate 05/23/24 1105 85     Resp 05/23/24 1105 16     Temp 05/23/24 1105 98.4 F (36.9 C)     Temp Source 05/23/24 1105 Oral     SpO2 05/23/24 1105 100 %     Weight 05/23/24 1102 222 lb 0.1 oz (100.7 kg)     Height --      Head Circumference --      Peak Flow --      Pain Score 05/23/24 1102 5     Pain Loc --      Pain Education --      Exclude from Growth Chart --    No data found.  Updated Vital Signs BP (!) 138/92 (BP Location: Left Arm)   Pulse 85   Temp 98.4 F (36.9 C) (Oral)   Resp 16   Wt 100.7 kg   SpO2 100%   BMI 32.78 kg/m   Visual Acuity Right Eye Distance:   Left Eye Distance:    Bilateral Distance:    Right Eye Near:   Left Eye Near:    Bilateral Near:     Physical Exam Constitutional:      General: He is not in acute distress.    Appearance: Normal appearance. He is normal weight. He is not ill-appearing or toxic-appearing.  Cardiovascular:     Rate and Rhythm: Normal rate and regular rhythm.  Pulmonary:     Effort: Pulmonary effort is normal.     Breath sounds: Normal breath sounds.  Musculoskeletal:     Comments: TTP to the right lower back, just above the SI joint;  pain with full rotation to the left  Skin:    General: Skin is warm.  Neurological:     General: No focal deficit present.     Mental Status: He is alert.  Psychiatric:        Mood and Affect: Mood normal.      UC Treatments / Results  Labs (all labs ordered are listed, but only abnormal results are displayed) Labs Reviewed  POCT URINE DIPSTICK - Abnormal; Notable for the following components:      Result Value   Glucose, UA =100 (*)    All other components within normal limits  GLUCOSE, POCT (MANUAL RESULT ENTRY) - Abnormal; Notable for the following components:   POCT Glucose ([facility]) 133 (*)    All other components within normal limits  COMPREHENSIVE METABOLIC PANEL WITH GFR    EKG   Radiology No results found.  Procedures Procedures (including critical care time)  Medications Ordered in UC Medications - No data to display  Initial Impression / Assessment and Plan / UC Course  I have reviewed the triage vital signs and the nursing notes.  Pertinent labs & imaging results that were available during my care of the patient were reviewed by me and considered in my medical decision making (see chart for details).   Final Clinical Impressions(s) / UC Diagnoses   Final diagnoses:  Urinary frequency  Glucosuria  Flank pain     Discharge Instructions      You were seen today for flank pain.  Your urine did not show evidence of infection or kidney stone.  This  appears to be more muscular.  I have sent out a muscle relaxer to help  with pain.  This will make you tired/drowsy so please take when home and not driving.  You may use a heating pad to the area as well.  Your blood work will be resulted over the weekend.  If anything is abnormal you will be notified.  Please return or be seen by your primary care provider if not improving.     ED Prescriptions     Medication Sig Dispense Auth. Provider   cyclobenzaprine  (FLEXERIL ) 10 MG tablet Take 1 tablet (10 mg total) by mouth 2 (two) times daily as needed for muscle spasms. 20 tablet Darral Longs, MD      PDMP not reviewed this encounter.   Darral Longs, MD 05/23/24 1201

## 2024-05-24 LAB — COMPREHENSIVE METABOLIC PANEL WITH GFR
ALT: 33 IU/L (ref 0–44)
AST: 26 IU/L (ref 0–40)
Albumin: 4.7 g/dL (ref 4.1–5.1)
Alkaline Phosphatase: 84 IU/L (ref 44–121)
BUN/Creatinine Ratio: 22 — ABNORMAL HIGH (ref 9–20)
BUN: 17 mg/dL (ref 6–24)
Bilirubin Total: 0.4 mg/dL (ref 0.0–1.2)
CO2: 20 mmol/L (ref 20–29)
Calcium: 10.1 mg/dL (ref 8.7–10.2)
Chloride: 105 mmol/L (ref 96–106)
Creatinine, Ser: 0.78 mg/dL (ref 0.76–1.27)
Globulin, Total: 2.2 g/dL (ref 1.5–4.5)
Glucose: 105 mg/dL — ABNORMAL HIGH (ref 70–99)
Potassium: 4.5 mmol/L (ref 3.5–5.2)
Sodium: 140 mmol/L (ref 134–144)
Total Protein: 6.9 g/dL (ref 6.0–8.5)
eGFR: 109 mL/min/1.73 (ref >59)

## 2024-05-26 ENCOUNTER — Ambulatory Visit: Payer: Self-pay

## 2024-06-16 ENCOUNTER — Ambulatory Visit: Payer: 59 | Admitting: Family Medicine

## 2024-07-04 ENCOUNTER — Telehealth: Payer: Self-pay | Admitting: Family Medicine

## 2024-07-04 DIAGNOSIS — D171 Benign lipomatous neoplasm of skin and subcutaneous tissue of trunk: Secondary | ICD-10-CM

## 2024-07-04 NOTE — Telephone Encounter (Signed)
 Patient called about place on chest he is asking to have a referral to dermatology  Patient saw Chistopher georgeff pa [facility] dermatology Please advise

## 2024-07-04 NOTE — Telephone Encounter (Signed)
 Order placed pt advised.

## 2024-07-08 ENCOUNTER — Encounter: Payer: Self-pay | Admitting: Family Medicine

## 2024-08-17 ENCOUNTER — Other Ambulatory Visit: Payer: Self-pay | Admitting: Family Medicine

## 2024-08-17 DIAGNOSIS — G4733 Obstructive sleep apnea (adult) (pediatric): Secondary | ICD-10-CM

## 2024-08-17 DIAGNOSIS — I10 Essential (primary) hypertension: Secondary | ICD-10-CM

## 2024-08-21 ENCOUNTER — Encounter: Payer: Self-pay | Admitting: Family Medicine

## 2024-08-21 ENCOUNTER — Ambulatory Visit (INDEPENDENT_AMBULATORY_CARE_PROVIDER_SITE_OTHER): Admitting: Family Medicine

## 2024-08-21 VITALS — BP 128/82 | HR 85 | Ht 69.0 in | Wt 219.0 lb

## 2024-08-21 DIAGNOSIS — R81 Glycosuria: Secondary | ICD-10-CM

## 2024-08-21 DIAGNOSIS — Z0000 Encounter for general adult medical examination without abnormal findings: Secondary | ICD-10-CM

## 2024-08-21 DIAGNOSIS — Z1211 Encounter for screening for malignant neoplasm of colon: Secondary | ICD-10-CM

## 2024-08-21 LAB — POCT URINALYSIS DIP (CLINITEK)
Bilirubin, UA: NEGATIVE
Blood, UA: NEGATIVE
Glucose, UA: NEGATIVE mg/dL
Ketones, POC UA: NEGATIVE mg/dL
Leukocytes, UA: NEGATIVE
Nitrite, UA: NEGATIVE
POC PROTEIN,UA: NEGATIVE
Spec Grav, UA: 1.01 (ref 1.010–1.025)
Urobilinogen, UA: 0.2 U/dL
pH, UA: 7 (ref 5.0–8.0)

## 2024-08-21 MED ORDER — CYCLOBENZAPRINE HCL 10 MG PO TABS: 10.0000 mg | ORAL_TABLET | Freq: Two times a day (BID) | ORAL | 0 refills | Status: DC | PRN

## 2024-08-21 NOTE — Progress Notes (Signed)
 Complete physical exam  Patient: Patrick Walls.Male    DOB: 29-Oct-1975 48 y.o.   MRN: [id number]  Chief Complaint  Patient presents with   Annual Exam    Agreed to cologuard  Would like rx refill on cyclobenzaprine  for lower back pain Patient would like to check urine - a few months ago pt states he was experiencing kidney issues and glucose was found in his urine.     Subjective:    Patrick Madariaga. is a 48 y.o. male who presents today for a complete physical exam. He reports consuming a general diet. Stay active and works out He generally feels fairly well. He reports sleeping well. He does have additional problems to discuss today.   Discussed the use of AI scribe software for clinical note transcription with the patient, who gave verbal consent to proceed.  History of Present Illness Patrick Walls. Patrick Walls is a 48 year old male who presents for a follow-up regarding glucose in urine and potential diabetes evaluation.  Glucosuria and diabetes risk - Glucose detected in urine, prompting evaluation for diabetes. - Family history of diabetes. - Concerned about weight and dietary habits as contributing factors. - Diet includes daily consumption of lemon, lime, and cucumber water. - Occasional intake of sugar, ice cream, and sandwiches with lower-calorie bread.  Respiratory symptoms - Cough present for the past month. - Productive of yellow sputum. - Wheezing noted. - Symptoms improved with antibiotics.  Subcutaneous mass on left collarbone - Lipoma noted with perceived increase in size. - Considering removal but concerned about insurance coverage due to cosmetic classification.  Sleep issues   - taking 3 Tylenol  PM at bedtime and that does help. Wants to know if should take something else.  Melatonin cause Nightmares.   Patrick recent fall risk assessment:    12/31/2023    2:43 PM  Fall Risk   Falls in the past year? 0  Number falls in past yr: 0   Injury with Fall? 0   Risk for fall due to : No Fall Risks  Follow up Falls evaluation completed     Data saved with a previous flowsheet row definition     Patrick recent depression screenings:    08/21/2024    9:19 AM 12/31/2023    2:43 PM  PHQ 2/9 Scores  PHQ - 2 Score 0 0  PHQ- 9 Score 2         Patient Care Team: Alvan Dorothyann BIRCH, MD as PCP - General (Family Medicine) Shlomo Wilbert SAUNDERS, MD as PCP - Sleep Medicine (Cardiology)   ROS    Objective:    BP 128/82 (BP Location: Left Arm, Patient Position: Sitting, Cuff Size: Normal)   Pulse 85   Ht 5' 9 (1.753 m)   Wt 219 lb (99.3 kg)   SpO2 98%   BMI 32.34 kg/m     Physical Exam Constitutional:      Appearance: Normal appearance.  HENT:     Head: Normocephalic and atraumatic.     Right Ear: Tympanic membrane, ear canal and external ear normal.     Left Ear: Tympanic membrane, ear canal and external ear normal.     Nose: Nose normal.     Mouth/Throat:     Pharynx: Oropharynx is clear.  Eyes:     Extraocular Movements: Extraocular movements intact.     Conjunctiva/sclera: Conjunctivae normal.     Pupils: Pupils are equal, round, and reactive to light.  Neck:     Thyroid: No thyromegaly.  Cardiovascular:     Rate and Rhythm: Normal rate and regular rhythm.  Pulmonary:     Effort: Pulmonary effort is normal.     Breath sounds: Normal breath sounds.  Abdominal:     General: Bowel sounds are normal.     Palpations: Abdomen is soft.     Tenderness: There is no abdominal tenderness.  Musculoskeletal:        General: No swelling.     Cervical back: Neck supple.     Comments: Lipoma over the left collarbone  Skin:    General: Skin is warm and dry.  Neurological:     Mental Status: He is oriented to person, place, and time.  Psychiatric:        Mood and Affect: Mood normal.        Behavior: Behavior normal.       No results found for any visits on 08/21/24.      Assessment & Plan:    Routine  Health Maintenance and Physical Exam Immunization History  Administered Date(s) Administered   Influenza Split 07/05/2020   Influenza,trivalent, recombinat, inj, PF 07/05/2020   Influenza-Unspecified 06/19/2015   Tdap 06/19/2015, 03/22/2021    Health Maintenance  Topic Date Due   Pneumococcal Vaccine: 50+ Years (1 of 2 - PCV) Never done   Hepatitis B Vaccines 19-59 Average Risk (1 of 3 - 19+ 3-dose series) Never done   Zoster Vaccines- Shingrix (1 of 2) Never done   Fecal DNA (Cologuard)  10/02/2023   COVID-19 Vaccine (1) 09/06/2024 (Originally [birth year]/1979)   Influenza Vaccine  12/16/2024 (Originally 04/18/2024)   DTaP/Tdap/Td (3 - Td or Tdap) 03/23/2031   Hepatitis C Screening  Completed   HIV Screening  Completed   HPV VACCINES  Aged Out   Meningococcal B Vaccine  Aged Out    Discussed health benefits of physical activity, and encouraged him to engage in regular exercise appropriate for his age and condition.  Problem List Items Addressed This Visit   None Visit Diagnoses       Screen for colon cancer    -  Primary   Relevant Orders   Cologuard     Wellness examination       Relevant Orders   Lipid Panel With LDL/HDL Ratio   CMP14+EGFR   CBC     Glucosuria       Relevant Orders   HgB A1c       Assessment and Plan Assessment & Plan Adult Wellness Visit Discussed health maintenance, vaccinations, and screenings. Recommended pneumonia and shingles vaccines for individuals over 50. - Will consider pneumonia vaccine. - Will consider shingles vaccine.  Screening for malignant neoplasm of colon Colon cancer screening due. Discussed Cologuard test convenience and direct result delivery. - Sent Cologuard test kit.  Glucosuria, evaluation for diabetes Glucosuria detected. Differential includes diabetes mellitus. Discussed A1c test and dietary modifications for diabetes management. - Ordered A1c test. - Ordered repeat urine test for glucose.  Benign lipoma on left  collarbone, increasing in size Lipoma increasing in size. Discussed removal for cosmetic reasons and insurance coverage. - Contact insurance to determine coverage for lipoma removal. - Consider removal of lipoma if insurance covers it.   Return in about 6 months (around 02/19/2025) for Hypertension.    Dorothyann Byars, MD [facility] at [facility]

## 2024-08-21 NOTE — Addendum Note (Signed)
 Addended by: OLIVA-AVELLANEDA, Antwyne Pingree L on: 08/21/2024 10:14 AM   Modules accepted: Orders

## 2024-08-22 LAB — HEMOGLOBIN A1C
Est. average glucose Bld gHb Est-mCnc: 114 mg/dL
Hgb A1c MFr Bld: 5.6 % (ref 4.8–5.6)

## 2024-08-22 LAB — CMP14+EGFR
ALT: 42 IU/L (ref 0–44)
AST: 36 IU/L (ref 0–40)
Albumin: 4.8 g/dL (ref 4.1–5.1)
Alkaline Phosphatase: 78 IU/L (ref 47–123)
BUN/Creatinine Ratio: 17 (ref 9–20)
BUN: 14 mg/dL (ref 6–24)
Bilirubin Total: 0.5 mg/dL (ref 0.0–1.2)
CO2: 23 mmol/L (ref 20–29)
Calcium: 10.6 mg/dL — ABNORMAL HIGH (ref 8.7–10.2)
Chloride: 101 mmol/L (ref 96–106)
Creatinine, Ser: 0.84 mg/dL (ref 0.76–1.27)
Globulin, Total: 2.6 g/dL (ref 1.5–4.5)
Glucose: 86 mg/dL (ref 70–99)
Potassium: 4.7 mmol/L (ref 3.5–5.2)
Sodium: 138 mmol/L (ref 134–144)
Total Protein: 7.4 g/dL (ref 6.0–8.5)
eGFR: 106 mL/min/1.73 (ref >59)

## 2024-08-22 LAB — LIPID PANEL WITH LDL/HDL RATIO
Cholesterol, Total: 187 mg/dL (ref 100–199)
HDL: 58 mg/dL (ref >39)
LDL Chol Calc (NIH): 114 mg/dL — ABNORMAL HIGH (ref 0–99)
LDL/HDL Ratio: 2 ratio (ref 0.0–3.6)
Triglycerides: 83 mg/dL (ref 0–149)
VLDL Cholesterol Cal: 15 mg/dL (ref 5–40)

## 2024-08-22 LAB — CBC
Hematocrit: 47.2 % (ref 37.5–51.0)
Hemoglobin: 15.6 g/dL (ref 13.0–17.7)
MCH: 31.3 pg (ref 26.6–33.0)
MCHC: 33.1 g/dL (ref 31.5–35.7)
MCV: 95 fL (ref 79–97)
Platelets: 345 x10E3/uL (ref 150–450)
RBC: 4.99 x10E6/uL (ref 4.14–5.80)
RDW: 11.9 % (ref 11.6–15.4)
WBC: 6.3 x10E3/uL (ref 3.4–10.8)

## 2024-08-25 ENCOUNTER — Ambulatory Visit: Payer: Self-pay | Admitting: Family Medicine

## 2024-08-25 NOTE — Progress Notes (Signed)
 [place], LDL cholesterol is mildly elevated continue to work on [employer].  Metabolic panel is good.  Blood counts normal.  A1c is in the normal range at 5.6.  No sign of diabetes.

## 2024-09-15 LAB — COLOGUARD: COLOGUARD: NEGATIVE

## 2024-09-15 NOTE — Progress Notes (Signed)
 Great news! Your Cologuard test is negative.  Recommend repeat colon cancer screening in 3 years.

## 2024-10-16 ENCOUNTER — Other Ambulatory Visit: Payer: Self-pay | Admitting: Family Medicine

## 2025-02-19 ENCOUNTER — Ambulatory Visit: Admitting: Family Medicine
